# Patient Record
Sex: Male | Born: 1935 | Race: Black or African American | Hispanic: No | Smoking: Current every day smoker
Health system: Southern US, Community
[De-identification: ages and names within clinical notes are randomized; demographics above are authoritative.]

## PROBLEM LIST (undated history)

## (undated) ENCOUNTER — Emergency Department (HOSPITAL_COMMUNITY): Discharge status: Absent without leave | Payer: Medicare HMO

## (undated) DIAGNOSIS — C189 Malignant neoplasm of colon, unspecified: Secondary | ICD-10-CM

## (undated) DIAGNOSIS — M1990 Unspecified osteoarthritis, unspecified site: Secondary | ICD-10-CM

## (undated) DIAGNOSIS — C787 Secondary malignant neoplasm of liver and intrahepatic bile duct: Secondary | ICD-10-CM

## (undated) HISTORY — PX: COLON SURGERY: SHX602

---

## 2012-01-01 HISTORY — PX: OTHER SURGICAL HISTORY: SHX169

## 2012-02-04 ENCOUNTER — Other Ambulatory Visit (HOSPITAL_COMMUNITY): Payer: Self-pay | Admitting: Internal Medicine

## 2012-02-04 DIAGNOSIS — R160 Hepatomegaly, not elsewhere classified: Secondary | ICD-10-CM

## 2012-02-13 ENCOUNTER — Encounter (HOSPITAL_COMMUNITY): Payer: Self-pay | Admitting: Pharmacy Technician

## 2012-02-14 ENCOUNTER — Other Ambulatory Visit: Payer: Self-pay | Admitting: Radiology

## 2012-02-18 ENCOUNTER — Encounter (HOSPITAL_COMMUNITY): Payer: Self-pay

## 2012-02-18 ENCOUNTER — Ambulatory Visit (HOSPITAL_COMMUNITY)
Admission: RE | Admit: 2012-02-18 | Discharge: 2012-02-18 | Disposition: A | Discharge status: Home / Self Care | Payer: Medicare (Managed Care) | Source: Ambulatory Visit | Attending: Internal Medicine | Admitting: Internal Medicine

## 2012-02-18 DIAGNOSIS — C189 Malignant neoplasm of colon, unspecified: Secondary | ICD-10-CM | POA: Insufficient documentation

## 2012-02-18 DIAGNOSIS — R160 Hepatomegaly, not elsewhere classified: Secondary | ICD-10-CM

## 2012-02-18 DIAGNOSIS — K769 Liver disease, unspecified: Secondary | ICD-10-CM | POA: Insufficient documentation

## 2012-02-18 DIAGNOSIS — K639 Disease of intestine, unspecified: Secondary | ICD-10-CM | POA: Insufficient documentation

## 2012-02-18 DIAGNOSIS — C229 Malignant neoplasm of liver, not specified as primary or secondary: Secondary | ICD-10-CM | POA: Insufficient documentation

## 2012-02-18 LAB — CBC
MCHC: 33.1 g/dL (ref 30.0–36.0)
MCV: 93.4 fL (ref 78.0–100.0)
Platelets: 270 10*3/uL (ref 150–400)
RDW: 14.1 % (ref 11.5–15.5)
WBC: 8.7 10*3/uL (ref 4.0–10.5)

## 2012-02-18 MED ORDER — MIDAZOLAM HCL 2 MG/2ML IJ SOLN
INTRAMUSCULAR | Status: AC
  Filled 2012-02-18: qty 4

## 2012-02-18 MED ORDER — FENTANYL CITRATE 0.05 MG/ML IJ SOLN
INTRAMUSCULAR | Status: AC
  Filled 2012-02-18: qty 2

## 2012-02-18 MED ORDER — MIDAZOLAM HCL 2 MG/2ML IJ SOLN
INTRAMUSCULAR | Status: AC
  Filled 2012-02-18: qty 2

## 2012-02-18 MED ORDER — FENTANYL CITRATE 0.05 MG/ML IJ SOLN
INTRAMUSCULAR | Status: AC | PRN
  Administered 2012-02-18 (×2): 50 ug via INTRAVENOUS

## 2012-02-18 MED ORDER — FENTANYL CITRATE 0.05 MG/ML IJ SOLN
INTRAMUSCULAR | Status: AC
  Filled 2012-02-18: qty 4

## 2012-02-18 MED ORDER — MIDAZOLAM HCL 5 MG/5ML IJ SOLN
INTRAMUSCULAR | Status: AC | PRN
  Administered 2012-02-18 (×2): 1 mg via INTRAVENOUS

## 2012-02-18 MED ORDER — SODIUM CHLORIDE 0.9 % IV SOLN
INTRAVENOUS | Status: DC
  Administered 2012-02-18: 13:00:00 via INTRAVENOUS

## 2012-02-18 NOTE — Procedures (Signed)
R lobe liver lesion Bx FNA/Core No comp

## 2012-02-18 NOTE — H&P (Signed)
[name] Fields is an 76 y.o. male.   Chief Complaint: patient with recent diagnosis of colon cancer s/p surgery.  Hepatic lesion noted and imaged. HPI: Hepatic lesion with new diagnosis of malignancy.  Patient presents for biopsy to establish diagnosis.   No past medical history on file.  Past Surgical History  Procedure Date  . Colon surgery     jan 2013    Social History:  reports that he has been smoking Cigarettes.  He has a 50 pack-year smoking history. He does not have any smokeless tobacco history on file. He reports that he drinks about 3.5 ounces of alcohol per week. He reports that he does not use illicit drugs.  Allergies: No Known Allergies  Medications Prior to Admission  Medication Sig Dispense Refill  . ibuprofen (ADVIL,MOTRIN) 200 MG tablet Take 400 mg by mouth every 6 (six) hours as needed. Pain       Medications Prior to Admission  Medication Dose Route Frequency Provider Last Rate Last Dose  . 0.9 %  sodium chloride infusion   Intravenous Continuous D Jeananne Rama, PA 20 mL/hr at 02/18/12 1246    . fentaNYL (SUBLIMAZE) 0.05 MG/ML injection           . fentaNYL (SUBLIMAZE) 0.05 MG/ML injection           . midazolam (VERSED) 2 MG/2ML injection           . midazolam (VERSED) 2 MG/2ML injection            Review of Systems  Constitutional: Negative for fever, chills and weight loss.  Respiratory: Negative for cough and hemoptysis.   Cardiovascular: Negative for chest pain and palpitations.  Gastrointestinal: Positive for abdominal pain. Negative for nausea, vomiting, constipation and blood in stool.  Skin: Negative.   Neurological: Negative for dizziness, sensory change and seizures.  Psychiatric/Behavioral: Negative for depression, suicidal ideas and memory loss. The patient is not nervous/anxious.     Physical Exam  Constitutional: He is oriented to person, place, and time. He appears well-developed.       Thin appearing   HENT:       Poor dentition     Cardiovascular: Normal rate and regular rhythm.  Exam reveals no gallop and no friction rub.   No murmur heard. Respiratory: Effort normal and breath sounds normal. No respiratory distress. He has no wheezes.  GI: Soft. Bowel sounds are normal. He exhibits no distension.  Musculoskeletal: Normal range of motion.  Neurological: He is alert and oriented to person, place, and time.  Skin: Skin is warm and dry.  Psychiatric: He has a normal mood and affect. His behavior is normal. Judgment and thought content normal.     Assessment/Plan Patient for needle core biopsy of hepatic lesion.  Procedure details discussed with patient.  Potential complications including but not limited to infection, bleeding, organ damage, moderate sedation complications and inadequate sampling discussed with his apparent understanding.  Written consent obtained.  Proceed with biopsy of lab results WNL to proceed - currently pending at time of interview with patient.   Kenley Troop D 02/18/2012, 12:54 PM

## 2012-02-22 ENCOUNTER — Encounter (HOSPITAL_COMMUNITY)
Admission: RE | Admit: 2012-02-22 | Discharge: 2012-02-22 | Disposition: A | Discharge status: Home / Self Care | Payer: Medicare (Managed Care) | Source: Ambulatory Visit | Attending: Internal Medicine | Admitting: Internal Medicine

## 2012-02-22 ENCOUNTER — Encounter (HOSPITAL_COMMUNITY): Payer: Self-pay

## 2012-02-22 DIAGNOSIS — R160 Hepatomegaly, not elsewhere classified: Secondary | ICD-10-CM

## 2012-02-22 DIAGNOSIS — K769 Liver disease, unspecified: Secondary | ICD-10-CM | POA: Insufficient documentation

## 2012-02-22 HISTORY — DX: Malignant neoplasm of colon, unspecified: C18.9

## 2012-02-22 LAB — GLUCOSE, CAPILLARY: Glucose-Capillary: 95 mg/dL (ref 70–99)

## 2012-02-22 MED ORDER — FLUDEOXYGLUCOSE F - 18 (FDG) INJECTION
15.6000 | Freq: Once | INTRAVENOUS | Status: AC | PRN
  Administered 2012-02-22: 15.6 via INTRAVENOUS

## 2012-05-20 ENCOUNTER — Encounter: Payer: Medicare Other | Admitting: Internal Medicine

## 2012-05-20 DIAGNOSIS — Z5111 Encounter for antineoplastic chemotherapy: Secondary | ICD-10-CM

## 2012-05-20 DIAGNOSIS — C20 Malignant neoplasm of rectum: Secondary | ICD-10-CM

## 2012-05-20 DIAGNOSIS — Z5112 Encounter for antineoplastic immunotherapy: Secondary | ICD-10-CM

## 2012-05-27 ENCOUNTER — Encounter (INDEPENDENT_AMBULATORY_CARE_PROVIDER_SITE_OTHER): Payer: Medicare (Managed Care) | Admitting: Internal Medicine

## 2012-05-27 DIAGNOSIS — G4700 Insomnia, unspecified: Secondary | ICD-10-CM

## 2012-05-27 DIAGNOSIS — C189 Malignant neoplasm of colon, unspecified: Secondary | ICD-10-CM

## 2012-05-27 DIAGNOSIS — R109 Unspecified abdominal pain: Secondary | ICD-10-CM

## 2012-06-10 ENCOUNTER — Encounter: Payer: Medicare Other | Admitting: Internal Medicine

## 2012-06-10 DIAGNOSIS — Z5111 Encounter for antineoplastic chemotherapy: Secondary | ICD-10-CM

## 2012-06-10 DIAGNOSIS — C20 Malignant neoplasm of rectum: Secondary | ICD-10-CM

## 2012-06-10 DIAGNOSIS — Z5112 Encounter for antineoplastic immunotherapy: Secondary | ICD-10-CM

## 2012-07-01 DIAGNOSIS — C20 Malignant neoplasm of rectum: Secondary | ICD-10-CM

## 2012-07-01 DIAGNOSIS — Z5112 Encounter for antineoplastic immunotherapy: Secondary | ICD-10-CM

## 2012-07-01 DIAGNOSIS — Z5111 Encounter for antineoplastic chemotherapy: Secondary | ICD-10-CM

## 2012-07-10 ENCOUNTER — Encounter: Payer: Medicare Other | Admitting: Internal Medicine

## 2012-07-10 DIAGNOSIS — C189 Malignant neoplasm of colon, unspecified: Secondary | ICD-10-CM

## 2012-07-22 ENCOUNTER — Encounter: Payer: Medicare Other | Admitting: Internal Medicine

## 2012-07-22 DIAGNOSIS — C20 Malignant neoplasm of rectum: Secondary | ICD-10-CM

## 2012-07-22 DIAGNOSIS — D709 Neutropenia, unspecified: Secondary | ICD-10-CM

## 2012-07-28 ENCOUNTER — Encounter: Payer: Medicare Other | Admitting: Internal Medicine

## 2012-07-28 DIAGNOSIS — Z5112 Encounter for antineoplastic immunotherapy: Secondary | ICD-10-CM

## 2012-07-28 DIAGNOSIS — C20 Malignant neoplasm of rectum: Secondary | ICD-10-CM

## 2012-07-28 DIAGNOSIS — Z5111 Encounter for antineoplastic chemotherapy: Secondary | ICD-10-CM

## 2012-08-07 DIAGNOSIS — C20 Malignant neoplasm of rectum: Secondary | ICD-10-CM

## 2012-08-07 DIAGNOSIS — D702 Other drug-induced agranulocytosis: Secondary | ICD-10-CM

## 2012-08-07 DIAGNOSIS — T451X5A Adverse effect of antineoplastic and immunosuppressive drugs, initial encounter: Secondary | ICD-10-CM

## 2012-08-12 ENCOUNTER — Encounter: Payer: Medicare Other | Admitting: Internal Medicine

## 2012-08-12 DIAGNOSIS — D709 Neutropenia, unspecified: Secondary | ICD-10-CM

## 2012-08-12 DIAGNOSIS — C20 Malignant neoplasm of rectum: Secondary | ICD-10-CM

## 2012-08-19 DIAGNOSIS — Z5111 Encounter for antineoplastic chemotherapy: Secondary | ICD-10-CM

## 2012-08-19 DIAGNOSIS — Z5112 Encounter for antineoplastic immunotherapy: Secondary | ICD-10-CM

## 2012-08-19 DIAGNOSIS — C189 Malignant neoplasm of colon, unspecified: Secondary | ICD-10-CM

## 2012-09-09 ENCOUNTER — Encounter: Payer: Medicare Other | Admitting: Internal Medicine

## 2012-09-10 ENCOUNTER — Other Ambulatory Visit (HOSPITAL_COMMUNITY): Payer: Self-pay | Admitting: Internal Medicine

## 2012-09-10 DIAGNOSIS — Z5111 Encounter for antineoplastic chemotherapy: Secondary | ICD-10-CM

## 2012-09-10 DIAGNOSIS — C189 Malignant neoplasm of colon, unspecified: Secondary | ICD-10-CM

## 2012-09-10 DIAGNOSIS — C787 Secondary malignant neoplasm of liver and intrahepatic bile duct: Secondary | ICD-10-CM

## 2012-09-17 ENCOUNTER — Ambulatory Visit
Admission: RE | Admit: 2012-09-17 | Discharge: 2012-09-17 | Disposition: A | Discharge status: Home / Self Care | Payer: Medicare Other | Source: Ambulatory Visit | Attending: Internal Medicine | Admitting: Internal Medicine

## 2012-09-17 VITALS — BP 106/68 | HR 67 | Temp 98.3°F | Resp 16 | Ht 71.0 in | Wt 140.0 lb

## 2012-09-17 DIAGNOSIS — C787 Secondary malignant neoplasm of liver and intrahepatic bile duct: Secondary | ICD-10-CM

## 2012-09-17 HISTORY — DX: Unspecified osteoarthritis, unspecified site: M19.90

## 2012-09-18 ENCOUNTER — Telehealth: Payer: Self-pay | Admitting: Emergency Medicine

## 2012-09-18 NOTE — Telephone Encounter (Signed)
CALLED PT TO confirm CT ANGIO APPT AT [facility]OS[facility] FOR 09-23-12 AT 210PM REGISTER AT 145PM  ALSO CONFIRMED THIS APPT W/ PT. DAUGHTER( CHRISTINA)

## 2012-09-22 DIAGNOSIS — C189 Malignant neoplasm of colon, unspecified: Secondary | ICD-10-CM

## 2012-09-24 ENCOUNTER — Telehealth: Payer: Self-pay | Admitting: Emergency Medicine

## 2012-09-24 NOTE — Telephone Encounter (Signed)
LM FOR DAUGHTER STATING THAT CT ANGIO WAS GOOD FOR Y-90 PROCEDURE, FAXING INFO TO [facility] FOR [employer]. [facility] AND [facility] WILL CONTACT THEM TO SET UP APPT.  ALSO MENTIONED TRIED CALLED PT. AT HOME # AND WAS NOTHING BUT STATIC WHEN PHONE PICKED UP.

## 2012-09-29 ENCOUNTER — Other Ambulatory Visit (HOSPITAL_COMMUNITY): Payer: Self-pay | Admitting: Interventional Radiology

## 2012-09-29 DIAGNOSIS — C787 Secondary malignant neoplasm of liver and intrahepatic bile duct: Secondary | ICD-10-CM

## 2012-09-29 DIAGNOSIS — C189 Malignant neoplasm of colon, unspecified: Secondary | ICD-10-CM

## 2012-10-01 ENCOUNTER — Other Ambulatory Visit: Payer: Self-pay | Admitting: Radiology

## 2012-10-07 ENCOUNTER — Ambulatory Visit (HOSPITAL_COMMUNITY)
Admission: RE | Admit: 2012-10-07 | Discharge: 2012-10-07 | Disposition: A | Discharge status: Home / Self Care | Payer: Medicare Other | Source: Ambulatory Visit | Attending: Interventional Radiology | Admitting: Interventional Radiology

## 2012-10-07 ENCOUNTER — Encounter (HOSPITAL_COMMUNITY): Payer: Self-pay

## 2012-10-07 ENCOUNTER — Other Ambulatory Visit (HOSPITAL_COMMUNITY): Payer: Self-pay | Admitting: Interventional Radiology

## 2012-10-07 ENCOUNTER — Encounter (HOSPITAL_COMMUNITY)
Admission: RE | Admit: 2012-10-07 | Discharge: 2012-10-07 | Disposition: A | Discharge status: Home / Self Care | Payer: Medicare Other | Source: Ambulatory Visit | Attending: Interventional Radiology | Admitting: Interventional Radiology

## 2012-10-07 DIAGNOSIS — C19 Malignant neoplasm of rectosigmoid junction: Secondary | ICD-10-CM | POA: Insufficient documentation

## 2012-10-07 DIAGNOSIS — C787 Secondary malignant neoplasm of liver and intrahepatic bile duct: Secondary | ICD-10-CM

## 2012-10-07 DIAGNOSIS — R109 Unspecified abdominal pain: Secondary | ICD-10-CM | POA: Insufficient documentation

## 2012-10-07 DIAGNOSIS — C189 Malignant neoplasm of colon, unspecified: Secondary | ICD-10-CM

## 2012-10-07 DIAGNOSIS — R062 Wheezing: Secondary | ICD-10-CM | POA: Insufficient documentation

## 2012-10-07 LAB — COMPREHENSIVE METABOLIC PANEL
ALT: 14 U/L (ref 0–53)
Alkaline Phosphatase: 195 U/L — ABNORMAL HIGH (ref 39–117)
CO2: 27 mEq/L (ref 19–32)
Chloride: 101 mEq/L (ref 96–112)
GFR calc Af Amer: >90 mL/min (ref >90)
GFR calc non Af Amer: >90 mL/min (ref >90)
Glucose, Bld: 107 mg/dL — ABNORMAL HIGH (ref 70–99)
Potassium: 4.1 mEq/L (ref 3.5–5.1)
Sodium: 135 mEq/L (ref 135–145)

## 2012-10-07 LAB — CBC
Hemoglobin: 11.2 g/dL — ABNORMAL LOW (ref 13.0–17.0)
MCH: 32.7 pg (ref 26.0–34.0)
RBC: 3.42 MIL/uL — ABNORMAL LOW (ref 4.22–5.81)

## 2012-10-07 LAB — APTT: aPTT: 32 seconds (ref 24–37)

## 2012-10-07 MED ORDER — IOHEXOL 300 MG/ML  SOLN: 95.0000 mL | Freq: Once | INTRAMUSCULAR | Status: DC | PRN

## 2012-10-07 MED ORDER — HEPARIN SOD (PORK) LOCK FLUSH 100 UNIT/ML IV SOLN
INTRAVENOUS | Status: AC
  Filled 2012-10-07: qty 5

## 2012-10-07 MED ORDER — MIDAZOLAM HCL 5 MG/5ML IJ SOLN
INTRAMUSCULAR | Status: AC | PRN
  Administered 2012-10-07: 0.5 mg via INTRAVENOUS
  Administered 2012-10-07: 1 mg via INTRAVENOUS
  Administered 2012-10-07 (×3): 0.5 mg via INTRAVENOUS

## 2012-10-07 MED ORDER — MIDAZOLAM HCL 2 MG/2ML IJ SOLN
INTRAMUSCULAR | Status: AC
  Filled 2012-10-07: qty 6

## 2012-10-07 MED ORDER — SODIUM CHLORIDE 0.9 % IV SOLN
INTRAVENOUS | Status: DC
  Administered 2012-10-07: 10:00:00 via INTRAVENOUS

## 2012-10-07 MED ORDER — CEFAZOLIN SODIUM 1-5 GM-% IV SOLN
1.0000 g | Freq: Once | INTRAVENOUS | Status: AC
  Administered 2012-10-07: 1 g via INTRAVENOUS
  Filled 2012-10-07 (×2): qty 50

## 2012-10-07 MED ORDER — HEPARIN SOD (PORK) LOCK FLUSH 100 UNIT/ML IV SOLN
500.0000 [IU] | INTRAVENOUS | Status: DC
  Administered 2012-10-07: 500 [IU]

## 2012-10-07 MED ORDER — HEPARIN SOD (PORK) LOCK FLUSH 100 UNIT/ML IV SOLN: 500.0000 [IU] | INTRAVENOUS | Status: DC | PRN

## 2012-10-07 MED ORDER — FENTANYL CITRATE 0.05 MG/ML IJ SOLN
INTRAMUSCULAR | Status: AC | PRN
  Administered 2012-10-07 (×4): 50 ug via INTRAVENOUS

## 2012-10-07 MED ORDER — FENTANYL CITRATE 0.05 MG/ML IJ SOLN
INTRAMUSCULAR | Status: AC
  Filled 2012-10-07: qty 6

## 2012-10-07 MED ORDER — HYDROCODONE-ACETAMINOPHEN 5-325 MG PO TABS
ORAL_TABLET | ORAL | Status: AC
  Administered 2012-10-07: 1 via ORAL
  Filled 2012-10-07: qty 1

## 2012-10-07 MED ORDER — LIDOCAINE HCL 1 % IJ SOLN
INTRAMUSCULAR | Status: AC
  Filled 2012-10-07: qty 20

## 2012-10-07 MED ORDER — HYDROCODONE-ACETAMINOPHEN 5-325 MG PO TABS
1.0000 | ORAL_TABLET | ORAL | Status: DC | PRN
  Administered 2012-10-07: 1 via ORAL

## 2012-10-07 MED ORDER — TECHNETIUM TO 99M ALBUMIN AGGREGATED
5.0000 | Freq: Once | INTRAVENOUS | Status: AC | PRN
  Administered 2012-10-07: 5 via INTRAVENOUS

## 2012-10-07 NOTE — Procedures (Signed)
Pre Y-90 study Mesentaric angiogram and embolization GDA embo 5 mCi Tc 36m No comp

## 2012-10-07 NOTE — ED Notes (Signed)
5Fr sheath removed from RFA by Dr. Bonnielee Haff.  Exoseal arterial closure device used.  Manual pressure applied X 5 mins.  R groin level 0.  3+RDP.

## 2012-10-07 NOTE — Progress Notes (Signed)
Patient out of bed and ambulated in hallway and used restroom. No complaints. Denies pain, SOB and dizziness. Dressing stayed CDI.

## 2012-10-07 NOTE — H&P (Signed)
[name] Fields is an 76 y.o. male.   Chief Complaint: colon ca with mets to  Liver (bilateral) Consulted with Dr Bonnielee Haff for Y90 procedure/treatment Scheduled now for pre Y90 arteriogram with possible embolization HPI: Colon ca; liver mets  Past Medical History  Diagnosis Date  . Colon cancer   . Arthritis     Past Surgical History  Procedure Date  . Colon surgery     jan 2013    No family history on file. Social History:  reports that he has been smoking Cigarettes.  He has a 50 pack-year smoking history. He does not have any smokeless tobacco history on file. He reports that he drinks about 3.5 ounces of alcohol per week. He reports that he does not use illicit drugs.  Allergies: No Known Allergies   (Not in a hospital admission)  Results for orders placed during the hospital encounter of 10/07/12 (from the past 48 hour(s))  APTT     Status: Normal   Collection Time   10/07/12  8:50 AM      Component Value Range Comment   aPTT 32  24 - 37 seconds   CBC     Status: Abnormal   Collection Time   10/07/12  8:50 AM      Component Value Range Comment   WBC 10.5  4.0 - 10.5 K/uL    RBC 3.42 (*) 4.22 - 5.81 MIL/uL    Hemoglobin 11.2 (*) 13.0 - 17.0 g/dL    HCT 16.1 (*) 09.6 - 52.0 %    MCV 95.3  78.0 - 100.0 fL    MCH 32.7  26.0 - 34.0 pg    MCHC 34.4  30.0 - 36.0 g/dL    RDW 04.5 (*) 40.9 - 15.5 %    Platelets 139 (*) 150 - 400 K/uL   PROTIME-INR     Status: Normal   Collection Time   10/07/12  8:50 AM      Component Value Range Comment   Prothrombin Time 12.6  11.6 - 15.2 seconds    INR 0.95  0.00 - 1.49    No results found.  Review of Systems  Constitutional: Negative for fever.  Respiratory: Negative for shortness of breath.   Cardiovascular: Negative for chest pain.  Gastrointestinal: Positive for abdominal pain.  Musculoskeletal: Negative for back pain.  Neurological: Negative for headaches.    Blood pressure 105/67, pulse 66, temperature 97.9 F (36.6 C),  temperature source Oral, resp. rate 18, SpO2 98.00%. Physical Exam  Constitutional: He is oriented to person, place, and time. He appears well-developed.  Cardiovascular: Normal rate, regular rhythm and normal heart sounds.   No murmur heard. Respiratory: Effort normal. He has wheezes.  GI: Soft. Bowel sounds are normal. There is no tenderness.  Musculoskeletal: Normal range of motion.  Neurological: He is alert and oriented to person, place, and time.  Skin: Skin is warm and dry.  Psychiatric: He has a normal mood and affect. His behavior is normal. Judgment and thought content normal.     Assessment/Plan Colon ca; liver mets - bilateral Scheduled for pre Y90 mesenteric arteriogram with poss embolization today  and treatment at later date Consulted with Dr Bonnielee Haff 09/17/12 Pt and family aware of procedure benefits and risks and agreeable to proceed Consent signed and in chart.  Tramaine Snell A 10/07/2012, 9:18 AM

## 2012-10-07 NOTE — Progress Notes (Signed)
Pt arrives via bed post  Pre Y-90 . Pt states he has no pain from procedure on right leg but the left leg chronic pain is a 6/10

## 2012-10-15 ENCOUNTER — Encounter (HOSPITAL_COMMUNITY): Payer: Self-pay | Admitting: Pharmacy Technician

## 2012-10-17 ENCOUNTER — Other Ambulatory Visit: Payer: Self-pay | Admitting: Radiology

## 2012-10-22 ENCOUNTER — Ambulatory Visit (HOSPITAL_COMMUNITY)
Admission: RE | Admit: 2012-10-22 | Discharge: 2012-10-22 | Disposition: A | Discharge status: Home / Self Care | Payer: Medicare Other | Source: Ambulatory Visit | Attending: Interventional Radiology | Admitting: Interventional Radiology

## 2012-10-22 ENCOUNTER — Other Ambulatory Visit (HOSPITAL_COMMUNITY): Payer: Self-pay | Admitting: Interventional Radiology

## 2012-10-22 ENCOUNTER — Encounter (HOSPITAL_COMMUNITY): Payer: Self-pay

## 2012-10-22 ENCOUNTER — Inpatient Hospital Stay (HOSPITAL_COMMUNITY): Admission: RE | Admit: 2012-10-22 | Payer: Medicare Other | Source: Ambulatory Visit

## 2012-10-22 ENCOUNTER — Other Ambulatory Visit: Payer: Self-pay | Admitting: Radiology

## 2012-10-22 ENCOUNTER — Encounter (HOSPITAL_COMMUNITY)
Admission: RE | Admit: 2012-10-22 | Discharge: 2012-10-22 | Disposition: A | Discharge status: Home / Self Care | Payer: Medicare Other | Source: Ambulatory Visit | Attending: Interventional Radiology | Admitting: Interventional Radiology

## 2012-10-22 DIAGNOSIS — C189 Malignant neoplasm of colon, unspecified: Secondary | ICD-10-CM

## 2012-10-22 DIAGNOSIS — C787 Secondary malignant neoplasm of liver and intrahepatic bile duct: Secondary | ICD-10-CM

## 2012-10-22 DIAGNOSIS — C19 Malignant neoplasm of rectosigmoid junction: Secondary | ICD-10-CM | POA: Insufficient documentation

## 2012-10-22 LAB — COMPREHENSIVE METABOLIC PANEL
ALT: 13 U/L (ref 0–53)
AST: 20 U/L (ref 0–37)
Alkaline Phosphatase: 147 U/L — ABNORMAL HIGH (ref 39–117)
CO2: 22 mEq/L (ref 19–32)
GFR calc Af Amer: >90 mL/min (ref >90)
GFR calc non Af Amer: >90 mL/min (ref >90)
Glucose, Bld: 108 mg/dL — ABNORMAL HIGH (ref 70–99)
Potassium: 4.4 mEq/L (ref 3.5–5.1)
Sodium: 133 mEq/L — ABNORMAL LOW (ref 135–145)

## 2012-10-22 LAB — CBC
Hemoglobin: 10.6 g/dL — ABNORMAL LOW (ref 13.0–17.0)
Platelets: 257 10*3/uL (ref 150–400)
RBC: 3.39 MIL/uL — ABNORMAL LOW (ref 4.22–5.81)
WBC: 6.8 10*3/uL (ref 4.0–10.5)

## 2012-10-22 LAB — APTT: aPTT: 30 seconds (ref 24–37)

## 2012-10-22 MED ORDER — NITROGLYCERIN 1 MG/10 ML FOR IR/CATH LAB
100.0000 ug | INTRA_ARTERIAL | Status: AC
  Administered 2012-10-22: 100 ug via INTRA_ARTERIAL

## 2012-10-22 MED ORDER — MIDAZOLAM HCL 2 MG/2ML IJ SOLN
INTRAMUSCULAR | Status: AC | PRN
  Administered 2012-10-22 (×4): 1 mg via INTRAVENOUS

## 2012-10-22 MED ORDER — PIPERACILLIN SOD-TAZOBACTAM SO 2.25 (2-0.25) G IV SOLR
3.3750 g | Freq: Once | INTRAVENOUS | Status: DC
  Filled 2012-10-22: qty 3.38

## 2012-10-22 MED ORDER — PANTOPRAZOLE SODIUM 40 MG IV SOLR
40.0000 mg | Freq: Once | INTRAVENOUS | Status: AC
  Administered 2012-10-22: 40 mg via INTRAVENOUS

## 2012-10-22 MED ORDER — PANTOPRAZOLE SODIUM 40 MG IV SOLR
INTRAVENOUS | Status: AC
  Filled 2012-10-22: qty 40

## 2012-10-22 MED ORDER — DOCUSATE SODIUM 100 MG PO CAPS
100.0000 mg | ORAL_CAPSULE | Freq: Two times a day (BID) | ORAL | Status: DC
  Filled 2012-10-22: qty 1

## 2012-10-22 MED ORDER — LIDOCAINE HCL 1 % IJ SOLN
INTRAMUSCULAR | Status: AC
  Filled 2012-10-22: qty 20

## 2012-10-22 MED ORDER — SODIUM CHLORIDE 0.9 % IJ SOLN: 3.0000 mL | INTRAMUSCULAR | Status: DC | PRN

## 2012-10-22 MED ORDER — IOHEXOL 300 MG/ML  SOLN
60.0000 mL | Freq: Once | INTRAMUSCULAR | Status: AC | PRN
  Administered 2012-10-22: 60 mL via INTRA_ARTERIAL

## 2012-10-22 MED ORDER — MIDAZOLAM HCL 2 MG/2ML IJ SOLN
INTRAMUSCULAR | Status: AC
  Filled 2012-10-22: qty 6

## 2012-10-22 MED ORDER — ONDANSETRON HCL 4 MG/2ML IJ SOLN: 4.0000 mg | Freq: Four times a day (QID) | INTRAMUSCULAR | Status: DC | PRN

## 2012-10-22 MED ORDER — ONDANSETRON HCL 4 MG/2ML IJ SOLN
INTRAMUSCULAR | Status: AC
  Filled 2012-10-22: qty 2

## 2012-10-22 MED ORDER — KETOROLAC TROMETHAMINE 30 MG/ML IJ SOLN
INTRAMUSCULAR | Status: AC
  Filled 2012-10-22: qty 1

## 2012-10-22 MED ORDER — SODIUM CHLORIDE 0.9 % IJ SOLN: 3.0000 mL | Freq: Two times a day (BID) | INTRAMUSCULAR | Status: DC

## 2012-10-22 MED ORDER — DEXAMETHASONE SODIUM PHOSPHATE 10 MG/ML IJ SOLN
20.0000 mg | Freq: Once | INTRAMUSCULAR | Status: AC
  Administered 2012-10-22: 20 mg via INTRAVENOUS

## 2012-10-22 MED ORDER — ONDANSETRON HCL 4 MG/2ML IJ SOLN
4.0000 mg | Freq: Once | INTRAMUSCULAR | Status: AC
  Administered 2012-10-22: 4 mg via INTRAVENOUS
  Filled 2012-10-22: qty 2

## 2012-10-22 MED ORDER — FENTANYL CITRATE 0.05 MG/ML IJ SOLN
INTRAMUSCULAR | Status: AC
  Filled 2012-10-22: qty 6

## 2012-10-22 MED ORDER — OMEPRAZOLE 20 MG PO CPDR: 20.0000 mg | DELAYED_RELEASE_CAPSULE | Freq: Every day | ORAL | Status: DC

## 2012-10-22 MED ORDER — FENTANYL CITRATE 0.05 MG/ML IJ SOLN
INTRAMUSCULAR | Status: AC | PRN
  Administered 2012-10-22 (×4): 50 ug via INTRAVENOUS

## 2012-10-22 MED ORDER — YTTRIUM 90 INJECTION: 27.7000 | INJECTION | Freq: Once | INTRAVENOUS | Status: DC

## 2012-10-22 MED ORDER — PROMETHAZINE HCL 25 MG PO TABS: 25.0000 mg | ORAL_TABLET | Freq: Three times a day (TID) | ORAL | Status: DC | PRN

## 2012-10-22 MED ORDER — SODIUM CHLORIDE 0.9 % IV SOLN
INTRAVENOUS | Status: DC
  Administered 2012-10-22: 09:00:00 via INTRAVENOUS

## 2012-10-22 MED ORDER — KETOROLAC TROMETHAMINE 30 MG/ML IJ SOLN
30.0000 mg | Freq: Once | INTRAMUSCULAR | Status: AC
  Administered 2012-10-22: 30 mg via INTRAVENOUS
  Filled 2012-10-22: qty 1

## 2012-10-22 MED ORDER — PIPERACILLIN-TAZOBACTAM 3.375 G IVPB 30 MIN
3.3750 g | Freq: Once | INTRAVENOUS | Status: AC
  Administered 2012-10-22: 3.375 g via INTRAVENOUS
  Filled 2012-10-22: qty 50

## 2012-10-22 MED ORDER — PROMETHAZINE HCL 25 MG RE SUPP
25.0000 mg | Freq: Three times a day (TID) | RECTAL | Status: DC | PRN
  Filled 2012-10-22: qty 1

## 2012-10-22 MED ORDER — SODIUM CHLORIDE 0.9 % IV SOLN: 250.0000 mL | INTRAVENOUS | Status: DC | PRN

## 2012-10-22 MED ORDER — DEXAMETHASONE SODIUM PHOSPHATE 10 MG/ML IJ SOLN
INTRAMUSCULAR | Status: AC
  Filled 2012-10-22: qty 2

## 2012-10-22 NOTE — ED Notes (Signed)
Occlusive gauze tegaderm drsg applied to RFA puncture site.  Clean, dry, intact.

## 2012-10-22 NOTE — Progress Notes (Signed)
Patient ambulated in hallway and then to restroom. Patient denies SOB, pain and lightheadedness.

## 2012-10-22 NOTE — Procedures (Signed)
Y-90 hepatic embolization R lobe 28.3 mCi No comp

## 2012-10-22 NOTE — ED Notes (Signed)
Pt transported to [facility] via bed with portable monitor and RN.  Lamont Snowball, RN assumed care.

## 2012-10-22 NOTE — Discharge Instructions (Signed)
° °  Post Y-90 Radioembolization Discharge Instructions ° °You have been given a radioactive material during your procedure.  While it is safe for you to be discharged home from the hospital, you need to proceed directly home.   ° °Do not use public transportation, including air travel, lasting more than 2 hours for 1 week. ° °Avoid crowded public places for 1 week. ° °Adult visitors should try to avoid close contact with you for 1 week.   ° °Children and pregnant females should not visit or have close contact with you for 1 week. ° °Items that you touch are not radioactive. ° °Do not sleep in the same bed as your partner for 1 week, and a condom should be used for sexual activity during the first 24 hours. ° °Your blood may be radioactive and caution should be used if any bleeding occurs during the recovery period. ° °Body fluids may be radioactive for 24 hours.  Wash your hands after voiding.  Men should sit to urinate.  Dispose of any soiled materials (flush down toilet or place in trash at home) during the first day. ° °Drink 6 to 8 glasses of fluids per day for 5 days to hydrate yourself. ° °If you need to see a doctor during the first week, you must let them know that you were treated with yttrium-90 microspheres, and will be slightly radioactive.  They can call Interventional Radiology [phone] with any questions. °

## 2012-10-22 NOTE — H&P (Signed)
Chief Complaint: Colon cancer with mets to liver HPI: [name] Fields is an 76 y.o. male with hx of colon cancer and liver mets to each lobe. He has seen Dr. Bonnielee Haff in consult and is in the midstm of Y-90 series. He underwent initial angiogram with coil embo of GDA a few weeks ago. He is now scheduled for first Y-90 treatment. He recalls the procedure and is familiar with angiogram. He has been well since his last intervention. Denies abd pain, fever, N/V No new illnesses or c/o otherwise. He did have some black coffee at 0600 today, otherwise NPO  Past Medical History:  Past Medical History  Diagnosis Date  . Colon cancer   . Arthritis     Past Surgical History:  Past Surgical History  Procedure Date  . Colon surgery     jan 2013    Family History: No family history on file.  Social History:  reports that he has been smoking Cigarettes.  He has a 50 pack-year smoking history. He does not have any smokeless tobacco history on file. He reports that he drinks about 3.5 ounces of alcohol per week. He reports that he does not use illicit drugs.  Allergies: No Known Allergies  Medications: HYDROcodone-acetaminophen (NORCO/VICODIN) 5-325 MG per tablet (Taking) 09/25/2012 Sig - Route: Take 1 tablet by mouth Every 6 hours as needed. For pain - Oral Class: Historical Med Number of times this order has been changed since signing: 1 Order [address] ibuprofen (ADVIL,MOTRIN) 200 MG tablet (Taking) Sig - Route: Take 400 mg by mouth every 6 (six) hours as needed. Pain -   Please HPI for pertinent positives, otherwise complete 10 system ROS negative.  Physical Exam: Blood pressure 106/74, pulse 70, temperature 97.6 F (36.4 C), temperature source Oral, resp. rate 18, height 5\' 11"  (1.803 m), weight 140 lb (63.504 kg), SpO2 98.00%. Body mass index is 19.53 kg/(m^2).   General Appearance:  Alert, cooperative, no distress, appears stated age  Head:  Normocephalic, without obvious abnormality,  atraumatic  ENT: Unremarkable  Neck: Supple, symmetrical, trachea midline, no adenopathy, thyroid: not enlarged, symmetric, no tenderness/mass/nodules  Lungs:   Clear to auscultation bilaterally, no w/r/r, respirations unlabored without use of accessory muscles.  Chest Wall:  No tenderness or deformity. Small brown know with scab at upper outer corner over post hub, NT, no erythema  Heart:  Regular rate and rhythm, S1, S2 normal, no murmur, rub or gallop. Carotids 2+ without bruit.  Abdomen:   Soft, non-tender, non distended. Bowel sounds active all four quadrants,  no masses, no organomegaly.  Extremities: Extremities normal, atraumatic, no cyanosis or edema  Pulses: 2+ and symmetric  Neurologic: Normal affect, no gross deficits.   No results found for this or any previous visit (from the past 48 hour(s)). No results found.  Assessment/Plan Colon cancer with bilateral liver mets Proceed with Y-90 treatment today Labs pending As precaution, will start peripheral IV. Discussed procedure, including risks, complications, and recovery with pt and family. Consent signed in chart  Brayton El PA-C 10/22/2012, 8:17 AM

## 2012-10-22 NOTE — Progress Notes (Signed)
Arrived today for prep for Y-90 in Radiology. Pt states he has 6 oz of black coffee at 0600 today. This was reported to Radiology/Nuclear Med. We are to continue with prep for procedure

## 2012-10-22 NOTE — ED Notes (Signed)
5 Fr Sheath removed for RFA by Dr. Bonnielee Haff.  Hemostasis achieved by using Exoseal device.  Manual pressure applied to artery port deployment X 2 mins by Dr. Bonnielee Haff.  R groin level 0, +2 RDP.

## 2012-10-22 NOTE — Progress Notes (Signed)
Pt has PAC right chest ,just lateral to Port is a pensil eraser sized dark raised area, no drainage that pt says "is not a mole and has come up since the port was put in and it is sore" Caryn Bee B here to speak with pt and evaluated this. It was decided to start peripheral IV for Y-90 procedure and have the port evaluated by Dr Bonnielee Haff when he sees pt. Peripheral IV and lab work drawn and tubed to lab without problem

## 2012-10-22 NOTE — ED Notes (Signed)
Pt transported to Nuc Med for post Y-90 treatment imaging via bed with nurse and portable monitor.

## 2012-10-24 DIAGNOSIS — C20 Malignant neoplasm of rectum: Secondary | ICD-10-CM

## 2012-10-24 DIAGNOSIS — Z23 Encounter for immunization: Secondary | ICD-10-CM

## 2012-10-24 DIAGNOSIS — C787 Secondary malignant neoplasm of liver and intrahepatic bile duct: Secondary | ICD-10-CM

## 2012-11-06 ENCOUNTER — Ambulatory Visit
Admission: RE | Admit: 2012-11-06 | Discharge: 2012-11-06 | Disposition: A | Discharge status: Home / Self Care | Payer: Medicare Other | Source: Ambulatory Visit | Attending: Radiology | Admitting: Radiology

## 2012-11-06 VITALS — BP 109/67 | HR 70 | Temp 97.9°F | Resp 16 | Ht 71.0 in | Wt 145.0 lb

## 2012-11-06 DIAGNOSIS — C787 Secondary malignant neoplasm of liver and intrahepatic bile duct: Secondary | ICD-10-CM

## 2012-11-06 DIAGNOSIS — C189 Malignant neoplasm of colon, unspecified: Secondary | ICD-10-CM

## 2012-11-06 DIAGNOSIS — K769 Liver disease, unspecified: Secondary | ICD-10-CM

## 2012-11-06 NOTE — Progress Notes (Signed)
Appetite good.  Has started gaining "a little weight".  Denies nausea, vomiting, or changes in bowel habits.  Denies bloating or Right flank discomfort.  States that he only occasionally required pain meds for arthritis.  Sleeping pattern unchanged.  Able to complete ADL's without assistance.  Endurance good.

## 2012-11-17 ENCOUNTER — Other Ambulatory Visit (HOSPITAL_COMMUNITY): Payer: Self-pay | Admitting: Interventional Radiology

## 2012-11-17 DIAGNOSIS — C19 Malignant neoplasm of rectosigmoid junction: Secondary | ICD-10-CM

## 2012-11-17 DIAGNOSIS — C787 Secondary malignant neoplasm of liver and intrahepatic bile duct: Secondary | ICD-10-CM

## 2012-12-08 ENCOUNTER — Encounter (INDEPENDENT_AMBULATORY_CARE_PROVIDER_SITE_OTHER): Payer: Medicare Other | Admitting: Internal Medicine

## 2012-12-08 DIAGNOSIS — C189 Malignant neoplasm of colon, unspecified: Secondary | ICD-10-CM

## 2012-12-10 ENCOUNTER — Encounter (HOSPITAL_COMMUNITY): Payer: Self-pay | Admitting: Pharmacy Technician

## 2012-12-15 ENCOUNTER — Other Ambulatory Visit: Payer: Self-pay | Admitting: Radiology

## 2012-12-16 ENCOUNTER — Other Ambulatory Visit (HOSPITAL_COMMUNITY): Payer: Self-pay | Admitting: Interventional Radiology

## 2012-12-16 ENCOUNTER — Ambulatory Visit (HOSPITAL_COMMUNITY)
Admission: RE | Admit: 2012-12-16 | Discharge: 2012-12-16 | Disposition: A | Discharge status: Home / Self Care | Payer: Medicare Other | Source: Ambulatory Visit | Attending: Interventional Radiology | Admitting: Interventional Radiology

## 2012-12-16 ENCOUNTER — Encounter (HOSPITAL_COMMUNITY)
Admission: RE | Admit: 2012-12-16 | Discharge: 2012-12-16 | Disposition: A | Discharge status: Home / Self Care | Payer: Medicare Other | Source: Ambulatory Visit | Attending: Interventional Radiology | Admitting: Interventional Radiology

## 2012-12-16 ENCOUNTER — Encounter (HOSPITAL_COMMUNITY): Payer: Self-pay

## 2012-12-16 DIAGNOSIS — C19 Malignant neoplasm of rectosigmoid junction: Secondary | ICD-10-CM

## 2012-12-16 DIAGNOSIS — C787 Secondary malignant neoplasm of liver and intrahepatic bile duct: Secondary | ICD-10-CM

## 2012-12-16 DIAGNOSIS — C801 Malignant (primary) neoplasm, unspecified: Secondary | ICD-10-CM | POA: Insufficient documentation

## 2012-12-16 LAB — COMPREHENSIVE METABOLIC PANEL
AST: 31 U/L (ref 0–37)
Albumin: 3.3 g/dL — ABNORMAL LOW (ref 3.5–5.2)
Chloride: 102 mEq/L (ref 96–112)
Creatinine, Ser: 0.77 mg/dL (ref 0.50–1.35)
Potassium: 4.1 mEq/L (ref 3.5–5.1)
Total Bilirubin: 0.3 mg/dL (ref 0.3–1.2)
Total Protein: 7.1 g/dL (ref 6.0–8.3)

## 2012-12-16 LAB — CBC
MCHC: 32.6 g/dL (ref 30.0–36.0)
Platelets: 159 10*3/uL (ref 150–400)
RDW: 14.4 % (ref 11.5–15.5)
WBC: 5.4 10*3/uL (ref 4.0–10.5)

## 2012-12-16 LAB — APTT: aPTT: 32 seconds (ref 24–37)

## 2012-12-16 LAB — PROTIME-INR
INR: 0.92 (ref 0.00–1.49)
Prothrombin Time: 12.3 seconds (ref 11.6–15.2)

## 2012-12-16 MED ORDER — FENTANYL CITRATE 0.05 MG/ML IJ SOLN
INTRAMUSCULAR | Status: AC | PRN
  Administered 2012-12-16: 25 ug via INTRAVENOUS
  Administered 2012-12-16 (×2): 50 ug via INTRAVENOUS
  Administered 2012-12-16: 25 ug via INTRAVENOUS

## 2012-12-16 MED ORDER — DEXAMETHASONE SODIUM PHOSPHATE 10 MG/ML IJ SOLN
20.0000 mg | Freq: Once | INTRAMUSCULAR | Status: AC
  Administered 2012-12-16: 20 mg via INTRAVENOUS

## 2012-12-16 MED ORDER — PANTOPRAZOLE SODIUM 40 MG IV SOLR
INTRAVENOUS | Status: AC
  Filled 2012-12-16: qty 40

## 2012-12-16 MED ORDER — FENTANYL CITRATE 0.05 MG/ML IJ SOLN
INTRAMUSCULAR | Status: AC
  Filled 2012-12-16: qty 6

## 2012-12-16 MED ORDER — SODIUM CHLORIDE 0.45 % IV SOLN
INTRAVENOUS | Status: DC
  Administered 2012-12-16: 07:00:00 via INTRAVENOUS

## 2012-12-16 MED ORDER — ONDANSETRON HCL 4 MG/2ML IJ SOLN
4.0000 mg | Freq: Once | INTRAMUSCULAR | Status: AC
  Administered 2012-12-16: 4 mg via INTRAVENOUS

## 2012-12-16 MED ORDER — PANTOPRAZOLE SODIUM 40 MG IV SOLR
40.0000 mg | Freq: Once | INTRAVENOUS | Status: AC
  Administered 2012-12-16: 40 mg via INTRAVENOUS

## 2012-12-16 MED ORDER — HEPARIN SOD (PORK) LOCK FLUSH 100 UNIT/ML IV SOLN
500.0000 [IU] | INTRAVENOUS | Status: AC | PRN
  Administered 2012-12-16: 500 [IU]
  Filled 2012-12-16: qty 5

## 2012-12-16 MED ORDER — KETOROLAC TROMETHAMINE 30 MG/ML IJ SOLN
30.0000 mg | Freq: Once | INTRAMUSCULAR | Status: AC
  Administered 2012-12-16: 30 mg via INTRAVENOUS

## 2012-12-16 MED ORDER — LIDOCAINE HCL 1 % IJ SOLN
INTRAMUSCULAR | Status: AC
  Filled 2012-12-16: qty 20

## 2012-12-16 MED ORDER — KETOROLAC TROMETHAMINE 30 MG/ML IJ SOLN
INTRAMUSCULAR | Status: AC
  Filled 2012-12-16: qty 1

## 2012-12-16 MED ORDER — ONDANSETRON HCL 4 MG/2ML IJ SOLN
INTRAMUSCULAR | Status: AC
  Filled 2012-12-16: qty 2

## 2012-12-16 MED ORDER — IOHEXOL 300 MG/ML  SOLN
80.0000 mL | Freq: Once | INTRAMUSCULAR | Status: AC | PRN
  Administered 2012-12-16: 80 mL via INTRA_ARTERIAL

## 2012-12-16 MED ORDER — DEXAMETHASONE SODIUM PHOSPHATE 10 MG/ML IJ SOLN
INTRAMUSCULAR | Status: AC
  Filled 2012-12-16: qty 2

## 2012-12-16 MED ORDER — MIDAZOLAM HCL 2 MG/2ML IJ SOLN
INTRAMUSCULAR | Status: AC | PRN
Start: 2012-12-16 — End: 2012-12-16
  Administered 2012-12-16: 1 mg via INTRAVENOUS
  Administered 2012-12-16 (×2): 0.5 mg via INTRAVENOUS
  Administered 2012-12-16: 1 mg via INTRAVENOUS

## 2012-12-16 MED ORDER — YTTRIUM 90 INJECTION: 15.9000 | INJECTION | Freq: Once | INTRAVENOUS | Status: DC

## 2012-12-16 MED ORDER — PIPERACILLIN-TAZOBACTAM 3.375 G IVPB
3.3750 g | Freq: Once | INTRAVENOUS | Status: AC
  Administered 2012-12-16: 3.375 g via INTRAVENOUS
  Filled 2012-12-16: qty 50

## 2012-12-16 MED ORDER — MIDAZOLAM HCL 2 MG/2ML IJ SOLN
INTRAMUSCULAR | Status: AC
  Filled 2012-12-16: qty 6

## 2012-12-16 NOTE — Procedures (Signed)
Y 90 treatment L lobe 15 mCi No comp

## 2012-12-16 NOTE — ED Notes (Signed)
5 Fr sheath removed form R fem artery by Dr. Bonnielee Haff.  Hemostasis achieved with Exoseal device.  R groin level 0.  Manual pressure applied X 3 mins.

## 2012-12-16 NOTE — ED Notes (Signed)
R groin level 0.  Tegaderm/gauze occlusive dressing CDI. 3+RDP.

## 2012-12-16 NOTE — ED Notes (Signed)
Pt transport to nuc med via bed with RN and monitor.

## 2012-12-16 NOTE — H&P (Signed)
Chief Complaint: Colon cancer with mets to liver HPI: [name] Fields is an 76 y.o. male with hx of colon cancer and liver mets to each lobe. He has seen Dr. Bonnielee Haff in consult and is in the midstm of Y-90 series. He underwent initial angiogram with coil embo of GDA a few weeks ago. He then underwent his Y-90 treatment to his right lobe met and is now scheduled for the left lobe treatment. He has been well since his last intervention. Denies abd pain, fever, N/V No new illnesses or c/o otherwise.   Past Medical History:  Past Medical History  Diagnosis Date  . Colon cancer   . Arthritis     Past Surgical History:  Past Surgical History  Procedure Date  . Colon surgery     jan 2013    Family History: No family history on file.  Social History:  reports that he has been smoking Cigarettes.  He has a 50 pack-year smoking history. He does not have any smokeless tobacco history on file. He reports that he drinks about 3.5 ounces of alcohol per week. He reports that he does not use illicit drugs.  Allergies: No Known Allergies  Medications: HYDROcodone-acetaminophen (NORCO/VICODIN) 5-325 MG per tablet (Taking) 09/25/2012 Sig - Route: Take 1 tablet by mouth Every 6 hours as needed. For pain - Oral Class: Historical Med Number of times this order has been changed since signing: 1 Order [address] Trail ibuprofen (ADVIL,MOTRIN) 200 MG tablet (Taking) Sig - Route: Take 400 mg by mouth every 6 (six) hours as needed. Pain -   Please HPI for pertinent positives, otherwise complete 10 system ROS negative.  Physical Exam: Blood pressure 117/76, pulse 60, temperature 97.7 F (36.5 C), temperature source Oral, resp. rate 18, SpO2 100.00%. There is no height or weight on file to calculate BMI.   General Appearance:  Alert, cooperative, no distress, appears stated age  Head:  Normocephalic, without obvious abnormality, atraumatic  ENT: Unremarkable  Neck: Supple, symmetrical, trachea midline, no  adenopathy, thyroid: not enlarged, symmetric, no tenderness/mass/nodules  Lungs:   Clear to auscultation bilaterally, no w/r/r, respirations unlabored without use of accessory muscles.  Chest Wall:  No tenderness or deformity. Small brown know with scab at upper outer corner over post hub, NT, no erythema  Heart:  Regular rate and rhythm, S1, S2 normal, no murmur, rub or gallop. Carotids 2+ without bruit.  Abdomen:   Soft, non-tender, non distended. Bowel sounds active all four quadrants,  no masses, no organomegaly.  Extremities: Extremities normal, atraumatic, no cyanosis or edema  Pulses: 2+ and symmetric  Neurologic: Normal affect, no gross deficits.   No results found for this or any previous visit (from the past 48 hour(s)). No results found.  Assessment/Plan Colon cancer with bilateral liver mets Proceed with Y-90 treatment today of left lobe mets Labs pending Port has been accessed Discussed procedure, including risks, complications, and recovery with pt and family. Consent signed in chart  Brayton El PA-C 12/16/2012, 7:50 AM

## 2012-12-17 ENCOUNTER — Telehealth: Payer: Self-pay | Admitting: Emergency Medicine

## 2012-12-17 ENCOUNTER — Other Ambulatory Visit: Payer: Self-pay | Admitting: Emergency Medicine

## 2012-12-17 DIAGNOSIS — IMO0001 Reserved for inherently not codable concepts without codable children: Secondary | ICD-10-CM

## 2012-12-17 MED ORDER — OMEPRAZOLE 10 MG PO CPDR: 10.0000 mg | DELAYED_RELEASE_CAPSULE | Freq: Every day | ORAL | Status: DC

## 2012-12-17 NOTE — Telephone Encounter (Signed)
LMOVM W/ ALL RX INFO TO REFILL PTS. PRILOSEC

## 2012-12-17 NOTE — Telephone Encounter (Signed)
CALLED PT TO CONTINUE PRILOSEC FOR THE NEXT 30 DAYS, WE WILL CALL IN RX AND PT WILL PU TODAY

## 2013-01-19 DIAGNOSIS — C787 Secondary malignant neoplasm of liver and intrahepatic bile duct: Secondary | ICD-10-CM

## 2013-01-19 DIAGNOSIS — C20 Malignant neoplasm of rectum: Secondary | ICD-10-CM

## 2013-01-19 DIAGNOSIS — Z452 Encounter for adjustment and management of vascular access device: Secondary | ICD-10-CM

## 2013-02-26 ENCOUNTER — Encounter (INDEPENDENT_AMBULATORY_CARE_PROVIDER_SITE_OTHER): Payer: Medicare Other | Admitting: Internal Medicine

## 2013-02-26 DIAGNOSIS — C189 Malignant neoplasm of colon, unspecified: Secondary | ICD-10-CM

## 2013-02-26 DIAGNOSIS — C787 Secondary malignant neoplasm of liver and intrahepatic bile duct: Secondary | ICD-10-CM

## 2013-02-26 DIAGNOSIS — D649 Anemia, unspecified: Secondary | ICD-10-CM

## 2013-03-11 ENCOUNTER — Encounter: Payer: Medicare Other | Admitting: Internal Medicine

## 2013-03-11 DIAGNOSIS — C787 Secondary malignant neoplasm of liver and intrahepatic bile duct: Secondary | ICD-10-CM

## 2013-03-11 DIAGNOSIS — C189 Malignant neoplasm of colon, unspecified: Secondary | ICD-10-CM

## 2013-03-17 ENCOUNTER — Other Ambulatory Visit (HOSPITAL_COMMUNITY): Payer: Self-pay | Admitting: Interventional Radiology

## 2013-03-17 DIAGNOSIS — C189 Malignant neoplasm of colon, unspecified: Secondary | ICD-10-CM

## 2013-03-31 DIAGNOSIS — Z5112 Encounter for antineoplastic immunotherapy: Secondary | ICD-10-CM

## 2013-03-31 DIAGNOSIS — Z5111 Encounter for antineoplastic chemotherapy: Secondary | ICD-10-CM

## 2013-03-31 DIAGNOSIS — C20 Malignant neoplasm of rectum: Secondary | ICD-10-CM

## 2013-04-02 DIAGNOSIS — C20 Malignant neoplasm of rectum: Secondary | ICD-10-CM

## 2013-04-08 ENCOUNTER — Ambulatory Visit
Admission: RE | Admit: 2013-04-08 | Discharge: 2013-04-08 | Disposition: A | Discharge status: Home / Self Care | Payer: Medicare PPO | Source: Ambulatory Visit | Attending: Interventional Radiology | Admitting: Interventional Radiology

## 2013-04-08 DIAGNOSIS — C189 Malignant neoplasm of colon, unspecified: Secondary | ICD-10-CM

## 2013-04-08 DIAGNOSIS — C787 Secondary malignant neoplasm of liver and intrahepatic bile duct: Secondary | ICD-10-CM

## 2013-04-13 NOTE — Progress Notes (Signed)
PT DOING GREAT! NO COMPLAINTS OF PAIN,BOWEL CHANGES, FEVER, CHILLS.  HE DOES HAVE TINGLING IN HIS TOES/FEET.

## 2013-04-20 ENCOUNTER — Encounter (INDEPENDENT_AMBULATORY_CARE_PROVIDER_SITE_OTHER): Payer: Medicare PPO | Admitting: Internal Medicine

## 2013-04-20 DIAGNOSIS — M549 Dorsalgia, unspecified: Secondary | ICD-10-CM

## 2013-04-20 DIAGNOSIS — C187 Malignant neoplasm of sigmoid colon: Secondary | ICD-10-CM

## 2013-04-20 DIAGNOSIS — J301 Allergic rhinitis due to pollen: Secondary | ICD-10-CM

## 2013-04-20 DIAGNOSIS — C787 Secondary malignant neoplasm of liver and intrahepatic bile duct: Secondary | ICD-10-CM

## 2013-04-21 DIAGNOSIS — C20 Malignant neoplasm of rectum: Secondary | ICD-10-CM

## 2013-04-21 DIAGNOSIS — Z5112 Encounter for antineoplastic immunotherapy: Secondary | ICD-10-CM

## 2013-04-21 DIAGNOSIS — Z5111 Encounter for antineoplastic chemotherapy: Secondary | ICD-10-CM

## 2013-04-23 DIAGNOSIS — C20 Malignant neoplasm of rectum: Secondary | ICD-10-CM

## 2013-05-11 ENCOUNTER — Encounter (INDEPENDENT_AMBULATORY_CARE_PROVIDER_SITE_OTHER): Payer: Medicare PPO | Admitting: Internal Medicine

## 2013-05-11 DIAGNOSIS — D709 Neutropenia, unspecified: Secondary | ICD-10-CM

## 2013-05-11 DIAGNOSIS — C189 Malignant neoplasm of colon, unspecified: Secondary | ICD-10-CM

## 2013-05-11 DIAGNOSIS — C787 Secondary malignant neoplasm of liver and intrahepatic bile duct: Secondary | ICD-10-CM

## 2013-05-11 DIAGNOSIS — D72819 Decreased white blood cell count, unspecified: Secondary | ICD-10-CM

## 2013-05-12 DIAGNOSIS — C189 Malignant neoplasm of colon, unspecified: Secondary | ICD-10-CM

## 2013-05-12 DIAGNOSIS — C787 Secondary malignant neoplasm of liver and intrahepatic bile duct: Secondary | ICD-10-CM

## 2013-05-12 DIAGNOSIS — Z5112 Encounter for antineoplastic immunotherapy: Secondary | ICD-10-CM

## 2013-05-12 DIAGNOSIS — Z5111 Encounter for antineoplastic chemotherapy: Secondary | ICD-10-CM

## 2013-05-14 DIAGNOSIS — C787 Secondary malignant neoplasm of liver and intrahepatic bile duct: Secondary | ICD-10-CM

## 2013-05-14 DIAGNOSIS — C20 Malignant neoplasm of rectum: Secondary | ICD-10-CM

## 2013-05-18 DIAGNOSIS — C189 Malignant neoplasm of colon, unspecified: Secondary | ICD-10-CM

## 2013-06-02 DIAGNOSIS — Z5111 Encounter for antineoplastic chemotherapy: Secondary | ICD-10-CM

## 2013-06-02 DIAGNOSIS — C259 Malignant neoplasm of pancreas, unspecified: Secondary | ICD-10-CM

## 2013-06-02 DIAGNOSIS — C787 Secondary malignant neoplasm of liver and intrahepatic bile duct: Secondary | ICD-10-CM

## 2013-06-02 DIAGNOSIS — Z5112 Encounter for antineoplastic immunotherapy: Secondary | ICD-10-CM

## 2013-06-04 DIAGNOSIS — C20 Malignant neoplasm of rectum: Secondary | ICD-10-CM

## 2013-06-05 DIAGNOSIS — D702 Other drug-induced agranulocytosis: Secondary | ICD-10-CM

## 2013-06-05 DIAGNOSIS — T451X5A Adverse effect of antineoplastic and immunosuppressive drugs, initial encounter: Secondary | ICD-10-CM

## 2013-06-05 DIAGNOSIS — C20 Malignant neoplasm of rectum: Secondary | ICD-10-CM

## 2013-06-22 ENCOUNTER — Encounter (INDEPENDENT_AMBULATORY_CARE_PROVIDER_SITE_OTHER): Payer: Medicare PPO | Admitting: Internal Medicine

## 2013-06-22 DIAGNOSIS — C787 Secondary malignant neoplasm of liver and intrahepatic bile duct: Secondary | ICD-10-CM

## 2013-06-22 DIAGNOSIS — C189 Malignant neoplasm of colon, unspecified: Secondary | ICD-10-CM

## 2013-06-23 DIAGNOSIS — Z5112 Encounter for antineoplastic immunotherapy: Secondary | ICD-10-CM

## 2013-06-23 DIAGNOSIS — C787 Secondary malignant neoplasm of liver and intrahepatic bile duct: Secondary | ICD-10-CM

## 2013-06-23 DIAGNOSIS — C189 Malignant neoplasm of colon, unspecified: Secondary | ICD-10-CM

## 2013-06-23 DIAGNOSIS — Z5111 Encounter for antineoplastic chemotherapy: Secondary | ICD-10-CM

## 2013-06-25 DIAGNOSIS — C20 Malignant neoplasm of rectum: Secondary | ICD-10-CM

## 2013-06-26 DIAGNOSIS — T451X5A Adverse effect of antineoplastic and immunosuppressive drugs, initial encounter: Secondary | ICD-10-CM

## 2013-06-26 DIAGNOSIS — C20 Malignant neoplasm of rectum: Secondary | ICD-10-CM

## 2013-06-26 DIAGNOSIS — D702 Other drug-induced agranulocytosis: Secondary | ICD-10-CM

## 2013-07-14 DIAGNOSIS — C20 Malignant neoplasm of rectum: Secondary | ICD-10-CM

## 2013-07-14 DIAGNOSIS — Z5112 Encounter for antineoplastic immunotherapy: Secondary | ICD-10-CM

## 2013-08-04 DIAGNOSIS — C20 Malignant neoplasm of rectum: Secondary | ICD-10-CM

## 2013-08-04 DIAGNOSIS — Z5112 Encounter for antineoplastic immunotherapy: Secondary | ICD-10-CM

## 2013-08-25 ENCOUNTER — Encounter (INDEPENDENT_AMBULATORY_CARE_PROVIDER_SITE_OTHER): Payer: Medicare PPO

## 2013-08-25 DIAGNOSIS — C20 Malignant neoplasm of rectum: Secondary | ICD-10-CM

## 2013-08-25 DIAGNOSIS — C787 Secondary malignant neoplasm of liver and intrahepatic bile duct: Secondary | ICD-10-CM

## 2013-08-25 DIAGNOSIS — Z5112 Encounter for antineoplastic immunotherapy: Secondary | ICD-10-CM

## 2013-09-15 DIAGNOSIS — Z5112 Encounter for antineoplastic immunotherapy: Secondary | ICD-10-CM

## 2013-09-15 DIAGNOSIS — C20 Malignant neoplasm of rectum: Secondary | ICD-10-CM

## 2013-09-24 DIAGNOSIS — Z23 Encounter for immunization: Secondary | ICD-10-CM

## 2013-09-24 DIAGNOSIS — C787 Secondary malignant neoplasm of liver and intrahepatic bile duct: Secondary | ICD-10-CM

## 2013-09-24 DIAGNOSIS — C20 Malignant neoplasm of rectum: Secondary | ICD-10-CM

## 2013-10-06 DIAGNOSIS — C20 Malignant neoplasm of rectum: Secondary | ICD-10-CM

## 2013-10-06 DIAGNOSIS — Z5112 Encounter for antineoplastic immunotherapy: Secondary | ICD-10-CM

## 2013-10-22 DIAGNOSIS — D696 Thrombocytopenia, unspecified: Secondary | ICD-10-CM

## 2013-10-22 DIAGNOSIS — C20 Malignant neoplasm of rectum: Secondary | ICD-10-CM

## 2013-10-22 DIAGNOSIS — C787 Secondary malignant neoplasm of liver and intrahepatic bile duct: Secondary | ICD-10-CM

## 2013-10-23 DIAGNOSIS — Z5112 Encounter for antineoplastic immunotherapy: Secondary | ICD-10-CM

## 2013-10-23 DIAGNOSIS — C20 Malignant neoplasm of rectum: Secondary | ICD-10-CM

## 2013-11-02 DIAGNOSIS — Z5112 Encounter for antineoplastic immunotherapy: Secondary | ICD-10-CM

## 2013-11-02 DIAGNOSIS — C189 Malignant neoplasm of colon, unspecified: Secondary | ICD-10-CM

## 2013-11-17 DIAGNOSIS — C20 Malignant neoplasm of rectum: Secondary | ICD-10-CM

## 2013-11-17 DIAGNOSIS — C787 Secondary malignant neoplasm of liver and intrahepatic bile duct: Secondary | ICD-10-CM

## 2013-11-17 DIAGNOSIS — Z5112 Encounter for antineoplastic immunotherapy: Secondary | ICD-10-CM

## 2013-12-01 DIAGNOSIS — C787 Secondary malignant neoplasm of liver and intrahepatic bile duct: Secondary | ICD-10-CM

## 2013-12-01 DIAGNOSIS — R109 Unspecified abdominal pain: Secondary | ICD-10-CM

## 2013-12-01 DIAGNOSIS — R51 Headache: Secondary | ICD-10-CM

## 2013-12-01 DIAGNOSIS — C20 Malignant neoplasm of rectum: Secondary | ICD-10-CM

## 2013-12-01 DIAGNOSIS — Z5112 Encounter for antineoplastic immunotherapy: Secondary | ICD-10-CM

## 2013-12-22 ENCOUNTER — Telehealth: Payer: Self-pay | Admitting: Emergency Medicine

## 2013-12-22 NOTE — Telephone Encounter (Signed)
Called pt to ck status.  He is doing well.  Does not want to f/u here, unless needed.  He will just f/u w/ his physician in [place].   He has another f/u appt w/ [facility] at the end of Dec.

## 2014-11-16 ENCOUNTER — Other Ambulatory Visit (HOSPITAL_COMMUNITY): Payer: Self-pay | Admitting: Oncology

## 2014-11-16 DIAGNOSIS — C189 Malignant neoplasm of colon, unspecified: Secondary | ICD-10-CM

## 2014-12-02 ENCOUNTER — Ambulatory Visit (HOSPITAL_COMMUNITY): Payer: Medicare PPO

## 2014-12-14 ENCOUNTER — Ambulatory Visit (HOSPITAL_COMMUNITY): Payer: Medicare PPO

## 2014-12-20 ENCOUNTER — Ambulatory Visit (HOSPITAL_COMMUNITY)
Admission: RE | Admit: 2014-12-20 | Discharge: 2014-12-20 | Disposition: A | Discharge status: Home / Self Care | Payer: Medicare PPO | Source: Ambulatory Visit | Attending: Oncology | Admitting: Oncology

## 2014-12-20 DIAGNOSIS — C189 Malignant neoplasm of colon, unspecified: Secondary | ICD-10-CM | POA: Diagnosis present

## 2014-12-20 LAB — GLUCOSE, CAPILLARY: GLUCOSE-CAPILLARY: 107 mg/dL — AB (ref 70–99)

## 2014-12-20 MED ORDER — FLUDEOXYGLUCOSE F - 18 (FDG) INJECTION
9.6000 | Freq: Once | INTRAVENOUS | Status: AC | PRN
  Administered 2014-12-20: 9.6 via INTRAVENOUS

## 2014-12-22 ENCOUNTER — Other Ambulatory Visit: Payer: Self-pay | Admitting: Oncology

## 2014-12-22 DIAGNOSIS — C787 Secondary malignant neoplasm of liver and intrahepatic bile duct: Principal | ICD-10-CM

## 2014-12-22 DIAGNOSIS — C189 Malignant neoplasm of colon, unspecified: Secondary | ICD-10-CM

## 2015-01-18 ENCOUNTER — Inpatient Hospital Stay: Admission: RE | Admit: 2015-01-18 | Payer: Medicare PPO | Source: Ambulatory Visit

## 2015-02-08 ENCOUNTER — Ambulatory Visit
Admission: RE | Admit: 2015-02-08 | Discharge: 2015-02-08 | Disposition: A | Discharge status: Home / Self Care | Payer: Medicare HMO | Source: Ambulatory Visit | Attending: Oncology | Admitting: Oncology

## 2015-02-08 ENCOUNTER — Other Ambulatory Visit: Payer: Self-pay | Admitting: Radiology

## 2015-02-08 DIAGNOSIS — C787 Secondary malignant neoplasm of liver and intrahepatic bile duct: Principal | ICD-10-CM

## 2015-02-08 DIAGNOSIS — C189 Malignant neoplasm of colon, unspecified: Secondary | ICD-10-CM

## 2015-02-08 HISTORY — PX: IR GENERIC HISTORICAL: IMG1180011

## 2015-02-08 NOTE — Progress Notes (Signed)
Patient ID: [name] Fields, male   DOB: 10-26-1936, 79 y.o.   MRN: [id number]   Chief Complaint: Chief Complaint  Patient presents with  . Follow-up    for Y-90 SIRT    Referring Physician(s): Neijstrom,Eric S  History of Present Illness: [name] Fields is a 79 y.o. male with colon cancer metastatic to the liver who underwent Y90 radio-embolization for bilateral liver disease, in December 2014. He is two years out and doing well. He is about 10 pounds heavier than his pretreatment weight and has an excellent appetite. He has been treated with systemic chemotherapy, but recently has started to develop neuropathy. His recent CEA was 16.6 in 12/14/2014. A CT at that time showed the the left lobe lesion was smaller and the right lobe lesion was stable in size. The accompanying PET show no activity in the left lesion and some mild activity at the periphery of the right lobe lesion.  Past Medical History  Diagnosis Date  . Colon cancer   . Arthritis     Past Surgical History  Procedure Laterality Date  . Colon surgery      jan 2013    Allergies: Review of patient's allergies indicates no known allergies.  Medications: Prior to Admission medications   Medication Sig Start Date End Date Taking? Authorizing Provider  HYDROcodone-acetaminophen (NORCO/VICODIN) 5-325 MG per tablet Take 1 tablet by mouth Every 6 hours as needed. For pain 09/25/12  Yes Historical Provider, MD  omeprazole (PRILOSEC) 10 MG capsule Take 1 capsule (10 mg total) by mouth daily. 12/17/12  Yes Art A Annaleigha Woo, MD    No family history on file.  History   Social History  . Marital Status: Widowed    Spouse Name: N/A    Number of Children: N/A  . Years of Education: N/A   Social History Main Topics  . Smoking status: Current Every Day Smoker -- 1.00 packs/day for 50 years    Types: Cigarettes  . Smokeless tobacco: Not on file  . Alcohol Use: 3.5 oz/week    7 drink(s) per week     Comment: 2-3 times/week  . Drug  Use: No  . Sexual Activity: Not on file   Other Topics Concern  . Not on file   Social History Narrative  . No narrative on file    ECOG Status: 0 - Asymptomatic  Review of Systems: A 12 point ROS discussed and pertinent positives are indicated in the HPI above.  All other systems are negative.  Review of Systems  Vital Signs: BP 114/65 mmHg  Pulse 64  Temp(Src) 98.2 F (36.8 C) (Oral)  Resp 14  Ht 5\' 11"  (1.803 m)  Wt 154 lb (69.854 kg)  BMI 21.49 kg/m2  SpO2 97%  Physical Exam  Constitutional: He is oriented to person, place, and time. He appears well-developed and well-nourished.  HENT:  Head: Normocephalic and atraumatic.  Musculoskeletal: Normal range of motion.  Neurological: He is alert and oriented to person, place, and time.  Skin: Skin is warm and dry.    Imaging: No results found.  Labs:  CBC: No results for input(s): WBC, HGB, HCT, PLT in the last 8760 hours.  COAGS: No results for input(s): INR, APTT in the last 8760 hours.  BMP: No results for input(s): NA, K, CL, CO2, GLUCOSE, BUN, CALCIUM, CREATININE, GFRNONAA, GFRAA in the last 8760 hours.  Invalid input(s): CMP  LIVER FUNCTION TESTS: Hb13.4 WBC 10.6 PLT 105 TBILI .7 ALC PHOS 250 LFT otherwise normal GFR  NL   TUMOR MARKERS: No results for input(s): AFPTM, CEA, CA199, CHROMGRNA in the last 8760 hours.  Assessment and Plan:  Mr. Winkles has done very well with treatment. He underwent bilateral liver Y90 therapy two years ago and now has minimal activity in the right lobe. He is a candidate for repeat Y90 treatment in the right lobe only. He will need Y90 angio mapping followed by right lobe treatment. He consents and his questions were answered.   Signed: Habib Kise, ART A 02/08/2015, 11:26 AM   I spent a total of 30 minutes face to face in clinical consultation, greater than 50% of which was counseling/coordinating care for Y90 therapy.

## 2015-02-15 ENCOUNTER — Other Ambulatory Visit (HOSPITAL_COMMUNITY): Payer: Self-pay | Admitting: Interventional Radiology

## 2015-02-15 ENCOUNTER — Telehealth: Payer: Self-pay | Admitting: Emergency Medicine

## 2015-02-15 DIAGNOSIS — C189 Malignant neoplasm of colon, unspecified: Secondary | ICD-10-CM

## 2015-02-15 DIAGNOSIS — C787 Secondary malignant neoplasm of liver and intrahepatic bile duct: Principal | ICD-10-CM

## 2015-02-15 NOTE — Telephone Encounter (Signed)
CALLED PT TO LET HIM KNOW THAT THE Y-90 WAS APPROVED BY HIS INSURANCE. TIFFANY WILL CALL HIM WITH HIS TREATMENT DATES.

## 2015-02-21 ENCOUNTER — Other Ambulatory Visit: Payer: Self-pay | Admitting: Radiology

## 2015-02-23 ENCOUNTER — Ambulatory Visit (HOSPITAL_COMMUNITY)
Admission: RE | Admit: 2015-02-23 | Discharge: 2015-02-23 | Disposition: A | Discharge status: Home / Self Care | Payer: Medicare HMO | Source: Ambulatory Visit | Attending: Interventional Radiology | Admitting: Interventional Radiology

## 2015-02-23 ENCOUNTER — Encounter (HOSPITAL_COMMUNITY)
Admission: RE | Admit: 2015-02-23 | Discharge: 2015-02-23 | Disposition: A | Discharge status: Home / Self Care | Payer: Medicare HMO | Source: Ambulatory Visit | Attending: Interventional Radiology | Admitting: Interventional Radiology

## 2015-02-23 ENCOUNTER — Other Ambulatory Visit (HOSPITAL_COMMUNITY): Payer: Self-pay | Admitting: Interventional Radiology

## 2015-02-23 ENCOUNTER — Encounter (HOSPITAL_COMMUNITY): Payer: Self-pay

## 2015-02-23 DIAGNOSIS — C189 Malignant neoplasm of colon, unspecified: Secondary | ICD-10-CM

## 2015-02-23 DIAGNOSIS — F17210 Nicotine dependence, cigarettes, uncomplicated: Secondary | ICD-10-CM | POA: Insufficient documentation

## 2015-02-23 DIAGNOSIS — Z79899 Other long term (current) drug therapy: Secondary | ICD-10-CM | POA: Insufficient documentation

## 2015-02-23 DIAGNOSIS — C787 Secondary malignant neoplasm of liver and intrahepatic bile duct: Secondary | ICD-10-CM | POA: Diagnosis not present

## 2015-02-23 DIAGNOSIS — G629 Polyneuropathy, unspecified: Secondary | ICD-10-CM | POA: Diagnosis not present

## 2015-02-23 LAB — CBC WITH DIFFERENTIAL/PLATELET
BASOS ABS: 0.1 10*3/uL (ref 0.0–0.1)
Basophils Relative: 1 % (ref 0–1)
Eosinophils Absolute: 0.1 10*3/uL (ref 0.0–0.7)
Eosinophils Relative: 2 % (ref 0–5)
HCT: 35.4 % — ABNORMAL LOW (ref 39.0–52.0)
Hemoglobin: 11.5 g/dL — ABNORMAL LOW (ref 13.0–17.0)
LYMPHS PCT: 24 % (ref 12–46)
Lymphs Abs: 1 10*3/uL (ref 0.7–4.0)
MCH: 32.4 pg (ref 26.0–34.0)
MCHC: 32.5 g/dL (ref 30.0–36.0)
MCV: 99.7 fL (ref 78.0–100.0)
Monocytes Absolute: 0.7 10*3/uL (ref 0.1–1.0)
Monocytes Relative: 16 % — ABNORMAL HIGH (ref 3–12)
NEUTROS PCT: 57 % (ref 43–77)
Neutro Abs: 2.6 10*3/uL (ref 1.7–7.7)
PLATELETS: 131 10*3/uL — AB (ref 150–400)
RBC: 3.55 MIL/uL — ABNORMAL LOW (ref 4.22–5.81)
RDW: 13.4 % (ref 11.5–15.5)
WBC: 4.4 10*3/uL (ref 4.0–10.5)

## 2015-02-23 LAB — COMPREHENSIVE METABOLIC PANEL
ALBUMIN: 3.9 g/dL (ref 3.5–5.2)
ALK PHOS: 176 U/L — AB (ref 39–117)
ALT: 17 U/L (ref 0–53)
ANION GAP: 5 (ref 5–15)
AST: 22 U/L (ref 0–37)
BUN: 12 mg/dL (ref 6–23)
CALCIUM: 8.8 mg/dL (ref 8.4–10.5)
CO2: 27 mmol/L (ref 19–32)
Chloride: 107 mmol/L (ref 96–112)
Creatinine, Ser: 0.77 mg/dL (ref 0.50–1.35)
GFR calc non Af Amer: 86 mL/min — ABNORMAL LOW (ref >90)
Glucose, Bld: 116 mg/dL — ABNORMAL HIGH (ref 70–99)
POTASSIUM: 4.1 mmol/L (ref 3.5–5.1)
SODIUM: 139 mmol/L (ref 135–145)
TOTAL PROTEIN: 7.3 g/dL (ref 6.0–8.3)
Total Bilirubin: 0.4 mg/dL (ref 0.3–1.2)

## 2015-02-23 LAB — APTT: aPTT: 33 s (ref 24–37)

## 2015-02-23 LAB — PROTIME-INR
INR: 1.03 (ref 0.00–1.49)
Prothrombin Time: 13.7 s (ref 11.6–15.2)

## 2015-02-23 MED ORDER — FENTANYL CITRATE 0.05 MG/ML IJ SOLN
INTRAMUSCULAR | Status: AC | PRN
  Administered 2015-02-23: 50 ug via INTRAVENOUS
  Administered 2015-02-23: 25 ug via INTRAVENOUS

## 2015-02-23 MED ORDER — LIDOCAINE HCL 1 % IJ SOLN
INTRAMUSCULAR | Status: AC
  Filled 2015-02-23: qty 20

## 2015-02-23 MED ORDER — MIDAZOLAM HCL 2 MG/2ML IJ SOLN
INTRAMUSCULAR | Status: AC
Start: 2015-02-23 — End: 2015-02-23
  Filled 2015-02-23: qty 6

## 2015-02-23 MED ORDER — HEPARIN SOD (PORK) LOCK FLUSH 100 UNIT/ML IV SOLN
500.0000 [IU] | Freq: Once | INTRAVENOUS | Status: AC
  Administered 2015-02-23: 500 [IU] via INTRAVENOUS
  Filled 2015-02-23: qty 5

## 2015-02-23 MED ORDER — TECHNETIUM TO 99M ALBUMIN AGGREGATED: 5.0000 | Freq: Once | INTRAVENOUS | Status: AC | PRN

## 2015-02-23 MED ORDER — FENTANYL CITRATE 0.05 MG/ML IJ SOLN
INTRAMUSCULAR | Status: AC
  Filled 2015-02-23: qty 4

## 2015-02-23 MED ORDER — IOHEXOL 300 MG/ML  SOLN
80.0000 mL | Freq: Once | INTRAMUSCULAR | Status: AC | PRN
Start: 2015-02-23 — End: 2015-02-23
  Administered 2015-02-23: 80 mL via INTRA_ARTERIAL

## 2015-02-23 MED ORDER — MIDAZOLAM HCL 2 MG/2ML IJ SOLN
INTRAMUSCULAR | Status: AC | PRN
  Administered 2015-02-23 (×2): 1 mg via INTRAVENOUS

## 2015-02-23 MED ORDER — SODIUM CHLORIDE 0.9 % IV SOLN
INTRAVENOUS | Status: DC
  Administered 2015-02-23: 08:00:00 via INTRAVENOUS

## 2015-02-23 NOTE — Sedation Documentation (Signed)
66fr. Sheath removed by Dr. Barbie Banner. Hemostasis achieved using manual pressure. Groin level 0. RDP +1.

## 2015-02-23 NOTE — H&P (Signed)
Chief Complaint: "I'm having another treatment of my liver"  Referring Physician(s): Dr. Jacquiline Doe  History of Present Illness: [name] Fields is a 79 y.o. male  with colon cancer metastatic to the liver who underwent Y90 radio-embolization for bilateral liver disease, in December 2014. He is two years out and doing well. He is about 10 pounds heavier than his pretreatment weight and has an excellent appetite. He has been treated with systemic chemotherapy, but recently has started to develop neuropathy. His recent CEA was 16.6 in 12/14/2014. A CT at that time showed the the left lobe lesion was smaller and the right lobe lesion was stable in size. The accompanying PET showed no activity in the left lesion and some mild activity at the periphery of the right lobe lesion. He presents today following recent IR evaluation for repeat hepatic/visceral arteriogram with embolization /test Y-90 dosing prior to Y-90 right hepatic radioembolization.   Past Medical History  Diagnosis Date  . Colon cancer   . Arthritis     Past Surgical History  Procedure Laterality Date  . Colon surgery      jan 2013  . Power port Right 2013    Allergies: Review of patient's allergies indicates no known allergies.  Medications: Prior to Admission medications   Medication Sig Start Date End Date Taking? Authorizing Provider  diphenoxylate-atropine (LOMOTIL) 2.5-0.025 MG per tablet Take 2 tablets by mouth 4 (four) times daily as needed for diarrhea or loose stools (Do not take more than 8 tablets per day.).  03/03/14  Yes Historical Provider, MD  HYDROcodone-acetaminophen (NORCO/VICODIN) 5-325 MG per tablet Take 1 tablet by mouth every 6 (six) hours as needed (For pain.).  09/25/12  Yes Historical Provider, MD  naphazoline-pheniramine (NAPHCON-A) 0.025-0.3 % ophthalmic solution Place 2 drops into both eyes 4 (four) times daily as needed for irritation or allergies.    Yes Historical Provider, MD  omeprazole  (PRILOSEC) 10 MG capsule Take 1 capsule (10 mg total) by mouth daily. Patient taking differently: Take 10 mg by mouth daily as needed (For heartburn or acid reflux.).  12/17/12  Yes Art A Hoss, MD  omeprazole (PRILOSEC) 20 MG capsule Take 20 mg by mouth daily as needed (For heartburn or acid reflux.).  04/06/14  Yes Historical Provider, MD  prochlorperazine (COMPAZINE) 10 MG tablet Take 10 mg by mouth every 6 (six) hours as needed for nausea or vomiting.  03/12/14  Yes Historical Provider, MD  zolpidem (AMBIEN) 5 MG tablet Take 5 mg by mouth at bedtime.  11/17/14  Yes Historical Provider, MD  acetaminophen (TYLENOL) 500 MG tablet Take 1,000 mg by mouth every 6 (six) hours as needed (For pain.).     Historical Provider, MD    History reviewed. No pertinent family history.  History   Social History  . Marital Status: Widowed    Spouse Name: N/A  . Number of Children: N/A  . Years of Education: N/A   Social History Main Topics  . Smoking status: Current Every Day Smoker -- 1.00 packs/day for 50 years    Types: Cigarettes  . Smokeless tobacco: Not on file  . Alcohol Use: 3.5 oz/week    7 Standard drinks or equivalent per week     Comment: 2-3 times/week  . Drug Use: No  . Sexual Activity: Not on file   Other Topics Concern  . None   Social History Narrative      Review of Systems   Constitutional: Negative for fever and chills.  Respiratory: Negative for shortness of breath.        Occ cough  Cardiovascular: Negative for chest pain.  Gastrointestinal: Negative for nausea, vomiting, abdominal pain and blood in stool.  Genitourinary: Negative for dysuria and hematuria.  Musculoskeletal: Negative for back pain.  Neurological: Negative for headaches.    Vital Signs: BP 119/69 mmHg  Pulse 64  Temp(Src) 97.6 F (36.4 C) (Oral)  Resp 16  SpO2 98%  Physical Exam  Constitutional: He is oriented to person, place, and time. He appears well-developed and well-nourished.    Cardiovascular: Normal rate and regular rhythm.   Clean, intact rt chest wall PAC   Pulmonary/Chest: Effort normal.  Dim BS rt base, left clear  Abdominal: Soft. Bowel sounds are normal. There is no tenderness.  Musculoskeletal: Normal range of motion. He exhibits no edema.  Neurological: He is alert and oriented to person, place, and time.    Imaging: No results found.  Labs:  CBC:  Recent Labs  02/23/15 0810  WBC 4.4  HGB 11.5*  HCT 35.4*  PLT 131*    COAGS:  Recent Labs  02/23/15 0810  INR 1.03  APTT 33    BMP: No results for input(s): NA, K, CL, CO2, GLUCOSE, BUN, CALCIUM, CREATININE, GFRNONAA, GFRAA in the last 8760 hours.  Invalid input(s): CMP  LIVER FUNCTION TESTS: No results for input(s): BILITOT, AST, ALT, ALKPHOS, PROT, ALBUMIN in the last 8760 hours.  TUMOR MARKERS: No results for input(s): AFPTM, CEA, CA199, CHROMGRNA in the last 8760 hours.  Assessment and Plan: [name] Fields is a 79 y.o. male  with colon cancer metastatic to the liver who underwent Y90 radio-embolization for bilateral liver disease, in December 2014. He is two years out and doing well. He is about 10 pounds heavier than his pretreatment weight and has an excellent appetite. He has been treated with systemic chemotherapy, but recently has started to develop neuropathy. His recent CEA was 16.6 in 12/14/2014. A CT at that time showed the the left lobe lesion was smaller and the right lobe lesion was stable in size. The accompanying PET showed no activity in the left lesion and some mild activity at the periphery of the right lobe lesion. He presents today following recent IR evaluation for repeat hepatic/visceral arteriogram with embolization /test Y-90 dosing prior to Y-90 right hepatic radioembolization.Details/risks of procedure d/w pt/son with their understanding and consent.      Signed: Autumn Messing 02/23/2015, 8:59 AM   I spent a total of 15 minutes face to face in  clinical consultation, greater than 50% of which was counseling/coordinating care for pre Y-90 hepatic/visceral  arteriogram with embolization/Y-90 test dosing

## 2015-02-23 NOTE — Discharge Instructions (Signed)
Angiogram An angiogram, also called angiography, is a procedure used to look at the blood vessels that carry blood to different parts of your body (arteries). In this procedure, dye is injected through a long, thin tube (catheter) into an artery. X-rays are then taken. The X-rays will show if there is a blockage or problem in a blood vessel.  LET [facility] CARE PROVIDER KNOW ABOUT:  Any allergies you have, including allergies to shellfish or contrast dye.   All medicines you are taking, including vitamins, herbs, eye drops, creams, and over-the-counter medicines.   Previous problems you or members of your family have had with the use of anesthetics.   Any blood disorders you have.   Previous surgeries you have had.  Any previous kidney problems or failure you have had.  Medical conditions you have.   Possibility of pregnancy, if this applies. RISKS AND COMPLICATIONS Generally, an angiogram is a safe procedure. However, as with any procedure, problems can occur. Possible problems include:  Injury to the blood vessels, including rupture or bleeding.  Infection or bruising at the catheter site.  Allergic reaction to the dye or contrast used.  Kidney damage from the dye or contrast used.  Blood clots that can lead to a stroke or heart attack. BEFORE THE PROCEDURE  Do not eat or drink after midnight on the night before the procedure, or as directed by your health care provider.   Ask your health care provider if you may drink enough water to take any needed medicines the morning of the procedure.  PROCEDURE  You may be given a medicine to help you relax (sedative) before and during the procedure. This medicine is given through an IV access tube that is inserted into one of your veins.   The area where the catheter will be inserted will be washed and shaved. This is usually done in the groin but may be done in the fold of your arm (near your elbow) or in the wrist.  A  medicine will be given to numb the area where the catheter will be inserted (local anesthetic).  The catheter will be inserted with a guide wire into an artery. The catheter is guided by using a type of X-ray (fluoroscopy) to the blood vessel being examined.   Dye is then injected into the catheter, and X-rays are taken. The dye helps to show where any narrowing or blockages are located.  AFTER THE PROCEDURE   If the procedure is done through the leg, you will be kept in bed lying flat for several hours. You will be instructed to not bend or cross your legs.  The insertion site will be checked frequently.  The pulse in your feet or wrist will be checked frequently.  Additional blood tests, X-rays, and electrocardiography may be done.   You may need to stay in the hospital overnight for observation.  Document Released: 09/26/2005 Document Revised: 12/22/2013 Document Reviewed: 05/20/2013 [facility] Patient Information 2015 [place], [place]. This information is not intended to replace advice given to you by your health care provider. Make sure you discuss any questions you have with your health care provider. Angiogram, Care After Refer to this sheet in the next few weeks. These instructions provide you with information on caring for yourself after your procedure. Your health care provider may also give you more specific instructions. Your treatment has been planned according to current medical practices, but problems sometimes occur. Call your health care provider if you have any problems  or questions after your procedure.  WHAT TO EXPECT AFTER THE PROCEDURE After your procedure, it is typical to have the following sensations:  Minor discomfort or tenderness and a small bump at the catheter insertion site. The bump should usually decrease in size and tenderness within 1 to 2 weeks.  Any bruising will usually fade within 2 to 4 weeks. HOME CARE INSTRUCTIONS   You may need to keep taking  blood thinners if they were prescribed for you. Take medicines only as directed by your health care provider.  Do not apply powder or lotion to the site.  Do not take baths, swim, or use a hot tub until your health care provider approves.  You may shower 24 hours after the procedure. Remove the bandage (dressing) and gently wash the site with plain soap and water. Gently pat the site dry.  Inspect the site at least twice daily.  Limit your activity for the first 48 hours. Do not bend, squat, or lift anything over 20 lb (9 kg) or as directed by your health care provider.  Plan to have someone take you home after the procedure. Follow instructions about when you can drive or return to work. SEEK MEDICAL CARE IF:  You get light-headed when standing up.  You have drainage (other than a small amount of blood on the dressing).  You have chills.  You have a fever.  You have redness, warmth, swelling, or pain at the insertion site. SEEK IMMEDIATE MEDICAL CARE IF:   You develop chest pain or shortness of breath, feel faint, or pass out.  You have bleeding, swelling larger than a walnut, or drainage from the catheter insertion site.  You develop pain, discoloration, coldness, or severe bruising in the leg or arm that held the catheter.  You develop bleeding from any other place, such as the bowels. You may see bright red blood in your urine or stools, or your stools may appear black and tarry.  You have heavy bleeding from the site. If this happens, hold pressure on the site. MAKE SURE YOU:  Understand these instructions.  Will watch your condition.  Will get help right away if you are not doing well or get worse. Document Released: 07/05/2005 Document Revised: 05/03/2014 Document Reviewed: 05/11/2013 [facility] Patient Information 2015 [place], [place]. This information is not intended to replace advice given to you by your health care provider. Make sure you discuss any questions you  have with your health care provider. Conscious Sedation Sedation is the use of medicines to promote relaxation and relieve discomfort and anxiety. Conscious sedation is a type of sedation. Under conscious sedation you are less alert than normal but are still able to respond to instructions or stimulation. Conscious sedation is used during short medical and dental procedures. It is milder than deep sedation or general anesthesia and allows you to return to your regular activities sooner.  LET [facility] CARE PROVIDER KNOW ABOUT:   Any allergies you have.  All medicines you are taking, including vitamins, herbs, eye drops, creams, and over-the-counter medicines.  Use of steroids (by mouth or creams).  Previous problems you or members of your family have had with the use of anesthetics.  Any blood disorders you have.  Previous surgeries you have had.  Medical conditions you have.  Possibility of pregnancy, if this applies.  Use of cigarettes, alcohol, or illegal drugs. RISKS AND COMPLICATIONS Generally, this is a safe procedure. However, as with any procedure, problems can occur. Possible problems  include:  Oversedation.  Trouble breathing on your own. You may need to have a breathing tube until you are awake and breathing on your own.  Allergic reaction to any of the medicines used for the procedure. BEFORE THE PROCEDURE  You may have blood tests done. These tests can help show how well your kidneys and liver are working. They can also show how well your blood clots.  A physical exam will be done.  Only take medicines as directed by your health care provider. You may need to stop taking medicines (such as blood thinners, aspirin, or nonsteroidal anti-inflammatory drugs) before the procedure.   Do not eat or drink at least 6 hours before the procedure or as directed by your health care provider.  Arrange for a responsible adult, family member, or friend to take you home after  the procedure. He or she should stay with you for at least 24 hours after the procedure, until the medicine has worn off. PROCEDURE   An intravenous (IV) catheter will be inserted into one of your veins. Medicine will be able to flow directly into your body through this catheter. You may be given medicine through this tube to help prevent pain and help you relax.  The medical or dental procedure will be done. AFTER THE PROCEDURE  You will stay in a recovery area until the medicine has worn off. Your blood pressure and pulse will be checked.   Depending on the procedure you had, you may be allowed to go home when you can tolerate liquids and your pain is under control. Document Released: 09/11/2001 Document Revised: 12/22/2013 Document Reviewed: 08/24/2013 [facility] Patient Information 2015 [place], [place]. This information is not intended to replace advice given to you by your health care provider. Make sure you discuss any questions you have with your health care provider.

## 2015-02-23 NOTE — Procedures (Signed)
Pre Y90 No new coils [id number] injected into the R lobe. No comp

## 2015-03-03 LAB — COMPREHENSIVE METABOLIC PANEL
ALBUMIN: 4 g/dL (ref 3.5–5.2)
ALK PHOS: 104 U/L (ref 39–117)
ALT: 13 U/L (ref 0–53)
AST: 18 U/L (ref 0–37)
BILIRUBIN TOTAL: 0.3 mg/dL (ref 0.2–1.2)
BUN: 13 mg/dL (ref 6–23)
CO2: 28 mEq/L (ref 19–32)
CREATININE: 1.29 mg/dL (ref 0.50–1.35)
Calcium: 9.1 mg/dL (ref 8.4–10.5)
Chloride: 104 mEq/L (ref 96–112)
Glucose, Bld: 108 mg/dL — ABNORMAL HIGH (ref 70–99)
Potassium: 4.3 mEq/L (ref 3.5–5.3)
SODIUM: 142 meq/L (ref 135–145)
Total Protein: 6.5 g/dL (ref 6.0–8.3)

## 2015-03-03 LAB — CBC
HCT: 41.4 % (ref 39.0–52.0)
Hemoglobin: 13.6 g/dL (ref 13.0–17.0)
MCH: 29.4 pg (ref 26.0–34.0)
MCHC: 32.9 g/dL (ref 30.0–36.0)
MCV: 89.4 fL (ref 78.0–100.0)
MPV: 11.2 fL (ref 8.6–12.4)
PLATELETS: 166 10*3/uL (ref 150–400)
RBC: 4.63 MIL/uL (ref 4.22–5.81)
RDW: 14.5 % (ref 11.5–15.5)
WBC: 2.8 10*3/uL — ABNORMAL LOW (ref 4.0–10.5)

## 2015-03-07 ENCOUNTER — Other Ambulatory Visit: Payer: Self-pay | Admitting: Radiology

## 2015-03-08 ENCOUNTER — Other Ambulatory Visit: Payer: Self-pay

## 2015-03-08 ENCOUNTER — Encounter (HOSPITAL_COMMUNITY): Payer: Self-pay

## 2015-03-08 ENCOUNTER — Ambulatory Visit (HOSPITAL_COMMUNITY)
Admission: RE | Admit: 2015-03-08 | Discharge: 2015-03-08 | Disposition: A | Discharge status: Home / Self Care | Payer: Medicare HMO | Source: Ambulatory Visit | Attending: Interventional Radiology | Admitting: Interventional Radiology

## 2015-03-08 ENCOUNTER — Encounter (HOSPITAL_COMMUNITY)
Admission: RE | Admit: 2015-03-08 | Discharge: 2015-03-08 | Disposition: A | Discharge status: Home / Self Care | Payer: Medicare HMO | Source: Ambulatory Visit | Attending: Interventional Radiology | Admitting: Interventional Radiology

## 2015-03-08 ENCOUNTER — Other Ambulatory Visit (HOSPITAL_COMMUNITY): Payer: Self-pay | Admitting: Interventional Radiology

## 2015-03-08 DIAGNOSIS — C787 Secondary malignant neoplasm of liver and intrahepatic bile duct: Secondary | ICD-10-CM | POA: Insufficient documentation

## 2015-03-08 DIAGNOSIS — C189 Malignant neoplasm of colon, unspecified: Secondary | ICD-10-CM

## 2015-03-08 LAB — CBC WITH DIFFERENTIAL/PLATELET
BASOS PCT: 1 % (ref 0–1)
Basophils Absolute: 0 10*3/uL (ref 0.0–0.1)
Eosinophils Absolute: 0.2 10*3/uL (ref 0.0–0.7)
Eosinophils Relative: 3 % (ref 0–5)
HCT: 36.9 % — ABNORMAL LOW (ref 39.0–52.0)
Hemoglobin: 12.1 g/dL — ABNORMAL LOW (ref 13.0–17.0)
LYMPHS ABS: 1 10*3/uL (ref 0.7–4.0)
Lymphocytes Relative: 19 % (ref 12–46)
MCH: 32.4 pg (ref 26.0–34.0)
MCHC: 32.8 g/dL (ref 30.0–36.0)
MCV: 98.7 fL (ref 78.0–100.0)
MONOS PCT: 11 % (ref 3–12)
Monocytes Absolute: 0.6 10*3/uL (ref 0.1–1.0)
Neutro Abs: 3.5 10*3/uL (ref 1.7–7.7)
Neutrophils Relative %: 66 % (ref 43–77)
Platelets: 142 10*3/uL — ABNORMAL LOW (ref 150–400)
RBC: 3.74 MIL/uL — ABNORMAL LOW (ref 4.22–5.81)
RDW: 13 % (ref 11.5–15.5)
WBC: 5.3 10*3/uL (ref 4.0–10.5)

## 2015-03-08 LAB — PROTIME-INR
INR: 1.05 (ref 0.00–1.49)
PROTHROMBIN TIME: 13.8 s (ref 11.6–15.2)

## 2015-03-08 LAB — COMPREHENSIVE METABOLIC PANEL
ALBUMIN: 4 g/dL (ref 3.5–5.2)
ALT: 16 U/L (ref 0–53)
AST: 22 U/L (ref 0–37)
Alkaline Phosphatase: 175 U/L — ABNORMAL HIGH (ref 39–117)
Anion gap: 1 — ABNORMAL LOW (ref 5–15)
BILIRUBIN TOTAL: 0.7 mg/dL (ref 0.3–1.2)
BUN: 11 mg/dL (ref 6–23)
CHLORIDE: 107 mmol/L (ref 96–112)
CO2: 27 mmol/L (ref 19–32)
CREATININE: 0.82 mg/dL (ref 0.50–1.35)
Calcium: 9 mg/dL (ref 8.4–10.5)
GFR calc Af Amer: >90 mL/min (ref >90)
GFR calc non Af Amer: 84 mL/min — ABNORMAL LOW (ref >90)
Glucose, Bld: 122 mg/dL — ABNORMAL HIGH (ref 70–99)
Potassium: 4.5 mmol/L (ref 3.5–5.1)
Sodium: 135 mmol/L (ref 135–145)
Total Protein: 7.6 g/dL (ref 6.0–8.3)

## 2015-03-08 LAB — APTT: aPTT: 32 seconds (ref 24–37)

## 2015-03-08 LAB — CK TOTAL AND CKMB (NOT AT ARMC)
CK TOTAL: 67 U/L (ref 7–232)
CK, MB: 1.5 ng/mL (ref 0.3–4.0)
RELATIVE INDEX: INVALID (ref 0.0–2.5)

## 2015-03-08 LAB — TROPONIN I: Troponin I: <0.03 ng/mL (ref <0.031)

## 2015-03-08 MED ORDER — ATROPINE SULFATE 0.1 MG/ML IJ SOLN
INTRAMUSCULAR | Status: AC
  Filled 2015-03-08: qty 10

## 2015-03-08 MED ORDER — CEFAZOLIN SODIUM-DEXTROSE 2-3 GM-% IV SOLR
INTRAVENOUS | Status: AC
  Filled 2015-03-08: qty 50

## 2015-03-08 MED ORDER — ATROPINE SULFATE 0.1 MG/ML IJ SOLN
INTRAMUSCULAR | Status: AC | PRN
  Administered 2015-03-08: 1 mg via INTRAVENOUS

## 2015-03-08 MED ORDER — ONDANSETRON HCL 4 MG/2ML IJ SOLN
4.0000 mg | Freq: Once | INTRAMUSCULAR | Status: AC
  Administered 2015-03-08: 4 mg via INTRAVENOUS
  Filled 2015-03-08: qty 2

## 2015-03-08 MED ORDER — SODIUM CHLORIDE 0.9 % IV SOLN
INTRAVENOUS | Status: DC
  Administered 2015-03-08: 08:00:00 via INTRAVENOUS

## 2015-03-08 MED ORDER — MIDAZOLAM HCL 2 MG/2ML IJ SOLN
INTRAMUSCULAR | Status: AC | PRN
Start: 2015-03-08 — End: 2015-03-08
  Administered 2015-03-08: 1 mg via INTRAVENOUS

## 2015-03-08 MED ORDER — FENTANYL CITRATE 0.05 MG/ML IJ SOLN
INTRAMUSCULAR | Status: AC
  Filled 2015-03-08: qty 4

## 2015-03-08 MED ORDER — LIDOCAINE HCL 1 % IJ SOLN
INTRAMUSCULAR | Status: AC
  Filled 2015-03-08: qty 20

## 2015-03-08 MED ORDER — PANTOPRAZOLE SODIUM 40 MG IV SOLR
40.0000 mg | Freq: Once | INTRAVENOUS | Status: AC
  Administered 2015-03-08: 40 mg via INTRAVENOUS
  Filled 2015-03-08: qty 40

## 2015-03-08 MED ORDER — MIDAZOLAM HCL 2 MG/2ML IJ SOLN
INTRAMUSCULAR | Status: AC
  Filled 2015-03-08: qty 6

## 2015-03-08 MED ORDER — HEPARIN SOD (PORK) LOCK FLUSH 100 UNIT/ML IV SOLN
500.0000 [IU] | INTRAVENOUS | Status: AC | PRN
  Administered 2015-03-08: 500 [IU]
  Filled 2015-03-08: qty 5

## 2015-03-08 MED ORDER — HYDROCODONE-ACETAMINOPHEN 5-325 MG PO TABS
1.0000 | ORAL_TABLET | ORAL | Status: DC | PRN
Start: 2015-03-08 — End: 2015-03-09
  Administered 2015-03-08: 1 via ORAL
  Filled 2015-03-08: qty 1

## 2015-03-08 MED ORDER — IOHEXOL 300 MG/ML  SOLN
50.0000 mL | Freq: Once | INTRAMUSCULAR | Status: AC | PRN
  Administered 2015-03-08: 58 mL via INTRA_ARTERIAL

## 2015-03-08 MED ORDER — CEFAZOLIN SODIUM-DEXTROSE 2-3 GM-% IV SOLR
2.0000 g | Freq: Once | INTRAVENOUS | Status: AC
  Administered 2015-03-08: 2 g via INTRAVENOUS

## 2015-03-08 MED ORDER — FENTANYL CITRATE 0.05 MG/ML IJ SOLN
INTRAMUSCULAR | Status: AC | PRN
  Administered 2015-03-08 (×2): 25 ug via INTRAVENOUS

## 2015-03-08 NOTE — Progress Notes (Signed)
Late entry from 1500. Patient ready for discharge to home from short stay postY-90. Troponin and cardiac enzymes drawn post y-90 in interventional radiology. Troponin within normal limits and Dr. Barbie Banner aware. Ckmb not resulted prior to patient's ordered discharge time. Dr. Barbie Banner aware and stated patient may be d/c home at 1500 as ordered. Patient's abdominal pain lessened with Vicodin. VSS. All discharge instructions reviewed with patient and family and verbalized understanding. (Ckmb since resulted and within normal limits).

## 2015-03-08 NOTE — Sedation Documentation (Signed)
5Fr sheath removed from R fem artery by Dr. Barbie Banner. Hemostasis achieved by exoseal at 1100. Manual pressure held by Lambert Mody for 5 minutes. Groin level 0. RDP +3.

## 2015-03-08 NOTE — Discharge Instructions (Signed)
Post Y-90 Radioembolization Discharge Instructions  You have been given a radioactive material during your procedure.  While it is safe for you to be discharged home from the hospital, you need to proceed directly home.    Do not use public transportation, including air travel, lasting more than 2 hours for 1 week.  Avoid crowded public places for 1 week.  Adult visitors should try to avoid close contact with you for 1 week.    Children and pregnant females should not visit or have close contact with you for 1 week.  Items that you touch are not radioactive.  Do not sleep in the same bed as your partner for 1 week, and a condom should be used for sexual activity during the first 24 hours.  Your blood may be radioactive and caution should be used if any bleeding occurs during the recovery period.  Body fluids may be radioactive for 24 hours.  Wash your hands after voiding.  Men should sit to urinate.  Dispose of any soiled materials (flush down toilet or place in trash at home) during the first day.  Drink 6 to 8 glasses of fluids per day for 5 days to hydrate yourself.  If you need to see a doctor during the first week, you must let them know that you were treated with yttrium-90 microspheres, and will be slightly radioactive.  They can call Interventional Radiology [phone] with any questions.     Conscious Sedation Sedation is the use of medicines to promote relaxation and relieve discomfort and anxiety. Conscious sedation is a type of sedation. Under conscious sedation you are less alert than normal but are still able to respond to instructions or stimulation. Conscious sedation is used during short medical and dental procedures. It is milder than deep sedation or general anesthesia and allows you to return to your regular activities sooner.  LET [facility] CARE PROVIDER KNOW ABOUT:   Any allergies you have.  All medicines you are taking, including vitamins, herbs, eye drops,  creams, and over-the-counter medicines.  Use of steroids (by mouth or creams).  Previous problems you or members of your family have had with the use of anesthetics.  Any blood disorders you have.  Previous surgeries you have had.  Medical conditions you have.  Possibility of pregnancy, if this applies.  Use of cigarettes, alcohol, or illegal drugs. RISKS AND COMPLICATIONS Generally, this is a safe procedure. However, as with any procedure, problems can occur. Possible problems include:  Oversedation.  Trouble breathing on your own. You may need to have a breathing tube until you are awake and breathing on your own.  Allergic reaction to any of the medicines used for the procedure. BEFORE THE PROCEDURE  You may have blood tests done. These tests can help show how well your kidneys and liver are working. They can also show how well your blood clots.  A physical exam will be done.  Only take medicines as directed by your health care provider. You may need to stop taking medicines (such as blood thinners, aspirin, or nonsteroidal anti-inflammatory drugs) before the procedure.   Do not eat or drink at least 6 hours before the procedure or as directed by your health care provider.  Arrange for a responsible adult, family member, or friend to take you home after the procedure. He or she should stay with you for at least 24 hours after the procedure, until the medicine has worn off. PROCEDURE   An intravenous (IV) catheter  will be inserted into one of your veins. Medicine will be able to flow directly into your body through this catheter. You may be given medicine through this tube to help prevent pain and help you relax.  The medical or dental procedure will be done. AFTER THE PROCEDURE  You will stay in a recovery area until the medicine has worn off. Your blood pressure and pulse will be checked.   Depending on the procedure you had, you may be allowed to go home when you  can tolerate liquids and your pain is under control. Document Released: 09/11/2001 Document Revised: 12/22/2013 Document Reviewed: 08/24/2013 [facility] Patient Information 2015 [place], [place]. This information is not intended to replace advice given to you by your health care provider. Make sure you discuss any questions you have with your health care provider.

## 2015-03-08 NOTE — Sedation Documentation (Addendum)
Pts heart rate dropped from high 50s SB to low 40s SB. Pt alert and oriented. Pt stated feeling "sick on his stomach".  Pts blood pressure dropped to 73/43. Normal saline was then run wide open. Orders were given to administer 1mg  of atropine. Pts HR immediately increased from 40s SB to 90s NSR. Blood pressures after atropine administration 81/58, 105/69, 106/69, 97/60, 97/63. Pt began experiencing some chest discomfort. 12-Lead EKG was ordered in addition to CKMB and troponin labs. 12-lead EKG showed NSR. Will continue to monitor patient for any significant changes.

## 2015-03-08 NOTE — Procedures (Signed)
Y90 R lobe liver embo  No comp

## 2015-03-08 NOTE — H&P (Signed)
Chief Complaint: "I'm having another treatment of my liver"  Referring Physician(s): Dr. Jacquiline Doe  History of Present Illness: [name] Fields is a 79 y.o. male  with colon cancer metastatic to the liver who underwent Y90 radio-embolization for bilateral liver disease, in December 2014. He is two years out and doing well. He is about 10 pounds heavier than his pretreatment weight and has an excellent appetite. He has been treated with systemic chemotherapy, but recently has started to develop neuropathy. His recent CEA was 16.6 in 12/14/2014. A CT at that time showed the the left lobe lesion was smaller and the right lobe lesion was stable in size. The accompanying PET showed no activity in the left lesion and some mild activity at the periphery of the right lobe lesion.  He has undergone pre-Y90 angiogram on 2/24 and is now scheduled today for Y-90 radioembolization of right hepatic lobe lesion.  Past Medical History  Diagnosis Date  . Colon cancer   . Arthritis     Past Surgical History  Procedure Laterality Date  . Colon surgery      jan 2013  . Power port Right 2013    Allergies: Review of patient's allergies indicates no known allergies.  Medications: Prior to Admission medications   Medication Sig Start Date End Date Taking? Authorizing Provider  diphenoxylate-atropine (LOMOTIL) 2.5-0.025 MG per tablet Take 2 tablets by mouth 4 (four) times daily as needed for diarrhea or loose stools (Do not take more than 8 tablets per day.).  03/03/14  Yes Historical Provider, MD  HYDROcodone-acetaminophen (NORCO/VICODIN) 5-325 MG per tablet Take 1 tablet by mouth every 6 (six) hours as needed (For pain.).  09/25/12  Yes Historical Provider, MD  naphazoline-pheniramine (NAPHCON-A) 0.025-0.3 % ophthalmic solution Place 2 drops into both eyes 4 (four) times daily as needed for irritation or allergies.    Yes Historical Provider, MD  omeprazole (PRILOSEC) 10 MG capsule Take 1 capsule (10 mg  total) by mouth daily. Patient taking differently: Take 10 mg by mouth daily as needed (For heartburn or acid reflux.).  12/17/12  Yes Art A Hoss, MD  omeprazole (PRILOSEC) 20 MG capsule Take 20 mg by mouth daily as needed (For heartburn or acid reflux.).  04/06/14  Yes Historical Provider, MD  prochlorperazine (COMPAZINE) 10 MG tablet Take 10 mg by mouth every 6 (six) hours as needed for nausea or vomiting.  03/12/14  Yes Historical Provider, MD  zolpidem (AMBIEN) 5 MG tablet Take 5 mg by mouth at bedtime.  11/17/14  Yes Historical Provider, MD  acetaminophen (TYLENOL) 500 MG tablet Take 1,000 mg by mouth every 6 (six) hours as needed (For pain.).     Historical Provider, MD    History reviewed. No pertinent family history.  History   Social History  . Marital Status: Widowed    Spouse Name: N/A  . Number of Children: N/A  . Years of Education: N/A   Social History Main Topics  . Smoking status: Current Every Day Smoker -- 1.00 packs/day for 50 years    Types: Cigarettes  . Smokeless tobacco: Not on file  . Alcohol Use: 3.5 oz/week    7 Standard drinks or equivalent per week     Comment: 2-3 times/week  . Drug Use: No  . Sexual Activity: Not on file   Other Topics Concern  . None   Social History Narrative      Review of Systems  Constitutional: Negative for fever and chills.  Respiratory: Negative  for shortness of breath.        Occ cough  Cardiovascular: Negative for chest pain.  Gastrointestinal: Negative for nausea, vomiting, abdominal pain and blood in stool.  Genitourinary: Negative for dysuria and hematuria.  Musculoskeletal: Negative for back pain.  Neurological: Negative for headaches.    Vital Signs: BP 102/64 mmHg  Pulse 64  Temp(Src) 97.5 F (36.4 C) (Oral)  Resp 18  Ht 5\' 11"  (1.803 m)  Wt 154 lb (69.854 kg)  BMI 21.49 kg/m2  SpO2 100%  Physical Exam  Constitutional: He is oriented to person, place, and time. He appears well-developed and  well-nourished.  Neck: Neck supple. No JVD present. No tracheal deviation present.  Cardiovascular: Normal rate and regular rhythm.   Clean, intact rt chest wall PAC   Pulmonary/Chest: Effort normal and breath sounds normal. No respiratory distress.  CTA bilat  Abdominal: Soft. Bowel sounds are normal. There is no tenderness.  Musculoskeletal: Normal range of motion. He exhibits no edema.  Neurological: He is alert and oriented to person, place, and time.  Psychiatric: His behavior is normal. Judgment normal.    Imaging: Nm Liver Img Spect  02/23/2015   CLINICAL DATA:  Colorectal carcinoma with unresectable liver metastasis. Pre Yttrium 90 radial embolization evaluation.  EXAM: NUCLEAR MEDICINE LIVER SCAN; liver SPECT., 3D fusion  TECHNIQUE: Abdominal images were obtained in multiple projections after intrahepatic arterial injection of radiopharmaceutical. SPECT imaging was performed. Lung shunt calculation was performed.  RADIOPHARMACEUTICALS:  5.0 mCi technetium 99 MAA  COMPARISON:  CT 11/15/2014  FINDINGS: The injected microaggregated albumin localizes within the right hepatic lobe. No evidence of activity within the stomach, duodenum, or bowel.  Calculated shunt fraction to the lungs equals 5.1%.  IMPRESSION: 1. No significant extrahepatic radiotracer activity following intrahepatic arterial injection of MAA. 2. Lung shunt fraction equals 5.1%   Electronically Signed   By: Suzy Bouchard M.D.   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  MEDICATIONS AND MEDICAL HISTORY: Versed 2 mg, Fentanyl 75 mcg.  Additional Medications: None.  ANESTHESIA/SEDATION: Moderate sedation time: 31 minutes   CONTRAST:  Thirty-nine cc Omnipaque 300  PROCEDURE: The procedure, risks, benefits, and alternatives were explained to the patient. Questions regarding the procedure were encouraged and answered. The patient understands and consents to the procedure.  The right groin was prepped with Betadine in a sterile fashion, and a sterile drape was applied covering the operative field. A sterile gown and sterile gloves were used for the procedure.  Under sonographic guidance, a micropuncture needle was inserted into the right common femoral artery and removed over a 018 wire which was up sized to a Bentson. A 5 French sheath was inserted.  A chunk 2.5 catheter was advanced over the Bentson to the descending thoracic aorta. And the reverse curve was formed. Celiac axis was selected. Angiography was performed.  A Progreat high flow catheter was advanced through the [place] can into the common hepatic artery. Angiography was performed.  The catheter was advanced beyond the gastroduodenal artery and left hepatic branches, and into the main right hepatic artery. Angiography was performed.  5 mCi technetium 8m MAA was then injected into the right hepatic artery.  Micro catheter and  shunt catheter were removed. The sheath was removed. Hemostasis was achieved with direct pressure.  FINDINGS: Angiography of the celiac axis and hepatic arterial tree delineates hepatic arterial anatomy. Branch vessel anatomy is expected. Gastroduodenal artery remains occluded without re- cannulization. Right gastric artery takeoff from the left hepatic artery is noted. No embolization was performed.  COMPLICATIONS: None  IMPRESSION: Successful pre Y 90 technetium 47m MAAinjection. Nuclear medicine imaging is to follow.   Electronically Signed   By: Marybelle Killings M.D.   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  MEDICATIONS AND MEDICAL HISTORY: Versed 2 mg, Fentanyl 75 mcg.  Additional Medications: None.  ANESTHESIA/SEDATION: Moderate sedation time: 31 minutes  CONTRAST:  Thirty-nine cc Omnipaque 300  PROCEDURE: The procedure, risks, benefits, and alternatives were explained to the patient. Questions regarding the procedure were encouraged and answered. The patient understands and consents to the procedure.  The right groin was prepped with Betadine in a sterile fashion, and a sterile drape was applied covering the operative field. A sterile gown and sterile gloves were used for the procedure.  Under sonographic guidance, a micropuncture needle was inserted into the right common femoral artery and removed over a 018 wire which was up sized to a Bentson. A 5 French sheath was inserted.  A chunk 2.5 catheter was advanced over the Bentson to the descending thoracic aorta. And the reverse curve was formed. Celiac axis was selected. Angiography was performed.  A Progreat high flow catheter was advanced through the [place] can into the common hepatic artery. Angiography was performed.  The catheter was advanced beyond the gastroduodenal artery and left hepatic branches, and into the main right hepatic artery. Angiography was performed.  5 mCi technetium 74m MAA was then injected into the right hepatic artery.  Micro catheter and shunt catheter were removed. The sheath was removed. Hemostasis was achieved with direct pressure.  FINDINGS: Angiography of the celiac axis and hepatic arterial tree delineates hepatic arterial anatomy. Branch vessel anatomy is expected. Gastroduodenal artery remains occluded without re- cannulization. Right gastric artery takeoff from the left hepatic artery is noted. No embolization was performed.  COMPLICATIONS: None   IMPRESSION: Successful pre Y 90 technetium 65m MAAinjection. Nuclear medicine imaging is to follow.   Electronically Signed   By: Marybelle Killings M.D.   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  MEDICATIONS AND MEDICAL HISTORY: Versed 2 mg, Fentanyl 75 mcg.  Additional Medications: None.  ANESTHESIA/SEDATION: Moderate sedation time: 31 minutes  CONTRAST:  Thirty-nine cc Omnipaque 300  PROCEDURE: The procedure, risks, benefits, and alternatives were explained to the patient. Questions regarding the procedure were encouraged and answered. The patient understands and consents to the procedure.  The right groin was prepped with Betadine in a sterile fashion, and a sterile drape was applied covering the operative field. A sterile gown and sterile gloves were used for the procedure.  Under sonographic guidance, a micropuncture needle was inserted into the right common femoral artery and removed over a 018 wire which was up sized to a Bentson. A 5 French sheath was inserted.  A chunk 2.5 catheter was advanced over the Bentson to the descending thoracic aorta. And the reverse curve was formed. Celiac axis was selected. Angiography was performed.  A Progreat high flow catheter was advanced through the [place] can into the common hepatic artery. Angiography was performed.  The catheter was advanced beyond the gastroduodenal artery and left hepatic branches, and into the main right hepatic artery. Angiography was performed.  5 mCi technetium 61m MAA was then injected into the right hepatic artery.  Micro catheter and shunt catheter were removed. The sheath was removed. Hemostasis was achieved with direct  pressure.  FINDINGS: Angiography of the celiac axis and hepatic arterial tree delineates hepatic arterial anatomy. Branch vessel anatomy is expected. Gastroduodenal artery remains occluded without re- cannulization. Right gastric artery takeoff from the left hepatic artery is noted. No embolization was performed.  COMPLICATIONS: None  IMPRESSION: Successful pre Y 90 technetium 35m MAAinjection. Nuclear medicine imaging is to follow.   Electronically Signed   By: Marybelle Killings M.D.   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  MEDICATIONS AND MEDICAL HISTORY: Versed 2 mg, Fentanyl 75 mcg.  Additional Medications: None.  ANESTHESIA/SEDATION: Moderate sedation time: 31 minutes  CONTRAST:  Thirty-nine cc Omnipaque 300  PROCEDURE: The procedure, risks, benefits, and alternatives were explained to the patient. Questions regarding the procedure were encouraged and answered. The patient understands and consents to the procedure.  The right groin was prepped with Betadine in a sterile fashion, and a sterile drape was applied covering the operative field. A sterile gown and sterile gloves were used for the procedure.  Under sonographic guidance, a micropuncture needle was inserted into the right common femoral artery and removed over a 018 wire which was up sized to a Bentson. A 5 French sheath was inserted.  A chunk 2.5 catheter was advanced over the Bentson to the descending thoracic aorta. And the reverse curve was formed. Celiac axis was selected. Angiography was performed.  A Progreat high flow catheter was advanced through the [place] can into the common hepatic artery. Angiography was performed.  The catheter was  advanced beyond the gastroduodenal artery and left hepatic branches, and into the main right hepatic artery. Angiography was performed.  5 mCi technetium 61m MAA was then injected into the right hepatic artery.  Micro catheter and shunt catheter were removed. The sheath was removed. Hemostasis was achieved with direct pressure.  FINDINGS: Angiography of the celiac axis and hepatic arterial tree delineates hepatic arterial anatomy. Branch vessel anatomy is expected. Gastroduodenal artery remains occluded without re- cannulization. Right gastric artery takeoff from the left hepatic artery is noted. No embolization was performed.  COMPLICATIONS: None  IMPRESSION: Successful pre Y 90 technetium 18m MAAinjection. Nuclear medicine imaging is to follow.   Electronically Signed   By: Marybelle Killings M.D.   On: 02/23/2015 12:04   Ir Embo Arterial Not [place]  02/23/2015   CLINICAL DATA:  Coal rectal carcinoma within the right lobe of  the liver  EXAM: IR EMBO ARTERIAL NOT HEMORR HEMANG INC GUIDE ROADMAPPING; ADDITIONAL ARTERIOGRAPHY; IR ULTRASOUND GUIDANCE VASC ACCESS RIGHT; SELECTIVE VISCERAL ARTERIOGRAPHY  FLUOROSCOPY TIME:  6 minutes and 54 seconds.  MEDICATIONS AND MEDICAL HISTORY: Versed 2 mg, Fentanyl 75 mcg.  Additional Medications: None.  ANESTHESIA/SEDATION: Moderate sedation time: 31 minutes  CONTRAST:  Thirty-nine cc Omnipaque 300  PROCEDURE: The procedure, risks, benefits, and alternatives were explained to the patient. Questions regarding the procedure were encouraged and answered. The patient understands and consents to the procedure.  The right groin was prepped with Betadine in a sterile fashion, and a sterile drape was applied covering the operative field. A sterile gown and sterile gloves were used for the procedure.  Under sonographic guidance, a micropuncture needle was inserted into the right common femoral artery and removed over a 018 wire which was up sized to a Bentson. A 5  French sheath was inserted.  A chunk 2.5 catheter was advanced over the Bentson to the descending thoracic aorta. And the reverse curve was formed. Celiac axis was selected. Angiography was performed.  A Progreat high flow catheter was advanced through the [place] can into the common hepatic artery. Angiography was performed.  The catheter was advanced beyond the gastroduodenal artery and left hepatic branches, and into the main right hepatic artery. Angiography was performed.  5 mCi technetium 106m MAA was then injected into the right hepatic artery.  Micro catheter and shunt catheter were removed. The sheath was removed. Hemostasis was achieved with direct pressure.  FINDINGS: Angiography of the celiac axis and hepatic arterial tree delineates hepatic arterial anatomy. Branch vessel anatomy is expected. Gastroduodenal artery remains occluded without re- cannulization. Right gastric artery takeoff from the left hepatic artery is noted. No embolization was performed.  COMPLICATIONS: None  IMPRESSION: Successful pre Y 90 technetium 69m MAAinjection. Nuclear medicine imaging is to follow.   Electronically Signed   By: Marybelle Killings M.D.   On: 02/23/2015 12:04   Nm Fusion  02/23/2015   CLINICAL DATA:  Colorectal carcinoma with unresectable liver metastasis. Pre Yttrium 90 radial embolization evaluation.  EXAM: NUCLEAR MEDICINE LIVER SCAN; liver SPECT., 3D fusion  TECHNIQUE: Abdominal images were obtained in multiple projections after intrahepatic arterial injection of radiopharmaceutical. SPECT imaging was performed. Lung shunt calculation was performed.  RADIOPHARMACEUTICALS:  5.0 mCi technetium 99 MAA  COMPARISON:  CT 11/15/2014  FINDINGS: The injected microaggregated albumin localizes within the right hepatic lobe. No evidence of activity within the stomach, duodenum, or bowel.  Calculated shunt fraction to the lungs equals 5.1%.  IMPRESSION: 1. No significant extrahepatic radiotracer activity following intrahepatic  arterial injection of MAA. 2. Lung shunt fraction equals 5.1%   Electronically Signed   By: Suzy Bouchard M.D.   On: 02/23/2015 16:15    Labs:  CBC:  Recent Labs  02/23/15 0810 03/02/15 1427 03/08/15 0745  WBC 4.4 2.8* 5.3  HGB 11.5* 13.6 12.1*  HCT 35.4* 41.4 36.9*  PLT 131* 166 142*    COAGS:  Recent Labs  02/23/15 0810 03/08/15 0745  INR 1.03 1.05  APTT 33 32    BMP:  Recent Labs  02/23/15 0810 03/02/15 1427  NA 139 142  K 4.1 4.3  CL 107 104  CO2 27 28  GLUCOSE 116* 108*  BUN 12 13  CALCIUM 8.8 9.1  CREATININE 0.77 1.29  GFRNONAA 86*  --   GFRAA >90  --     LIVER FUNCTION TESTS:  Recent Labs  02/23/15 0810  03/02/15 1427  BILITOT 0.4 0.3  AST 22 18  ALT 17 13  ALKPHOS 176* 104  PROT 7.3 6.5  ALBUMIN 3.9 4.0    TUMOR MARKERS: No results for input(s): AFPTM, CEA, CA199, CHROMGRNA in the last 8760 hours.  Assessment and Plan: Metastatic colon cancer with right hepatic lobe lesion. Plan for Y-90 embo today Labs reviewed, ok Risks and Benefits discussed with the patient including, but not limited to bleeding, infection, vascular injury, post procedural pain, nausea, vomiting and fatigue, contrast induced renal failure, liver failure, radiation injury to the bowel, radiation induced cholecystitis, neutropenia and possible need for additional procedures. All of the patient's questions were answered, patient is agreeable to proceed. Consent signed and in chart.     SignedAscencion Dike 03/08/2015, 8:47 AM   I spent a total of 15 minutes face to face in clinical consultation, greater than 50% of which was counseling/coordinating care for Y-90 radioembolization of right hepatic lobe metastasis

## 2015-03-09 ENCOUNTER — Telehealth: Payer: Self-pay | Admitting: Radiology

## 2015-03-09 ENCOUNTER — Other Ambulatory Visit (HOSPITAL_COMMUNITY): Payer: Self-pay | Admitting: Interventional Radiology

## 2015-03-09 DIAGNOSIS — C189 Malignant neoplasm of colon, unspecified: Secondary | ICD-10-CM

## 2015-03-09 DIAGNOSIS — C787 Secondary malignant neoplasm of liver and intrahepatic bile duct: Principal | ICD-10-CM

## 2015-03-09 MED ORDER — FAMOTIDINE 20 MG PO TABS: 20.0000 mg | ORAL_TABLET | Freq: Two times a day (BID) | ORAL | Status: DC

## 2015-03-09 MED ORDER — PROMETHAZINE HCL 50 MG PO TABS: 25.0000 mg | ORAL_TABLET | Freq: Four times a day (QID) | ORAL | Status: DC | PRN

## 2015-03-09 NOTE — Telephone Encounter (Signed)
Per Dr Barbie Banner:  Rx phoned to [facility], [place]:  For complaints of nausea, vomiting and abdominal discomfort 1 day post Y-90 SIRT.  Patient has follow up app't w/ Dr Maryclare Bean on March 30, 2015 at 11:00 am.  Reece Levy, RN 03/09/2015 11:46 AM

## 2015-03-09 NOTE — Progress Notes (Unsigned)
Phoned patient for 1 day follow up S/P Y-90 SIRT.  Patient has complaints of abdominal discomfort, nausea & vomiting.  Afebrile.  Dr Art Barbie Banner spoke with patient to confirm post procedure instructions.    Rx order phoned to [facility], [address] [place], [place], [place] 03/09/2015 11:39 AM

## 2015-03-09 NOTE — Telephone Encounter (Deleted)
error 

## 2015-03-30 ENCOUNTER — Ambulatory Visit
Admission: RE | Admit: 2015-03-30 | Discharge: 2015-03-30 | Disposition: A | Discharge status: Home / Self Care | Payer: Medicare HMO | Source: Ambulatory Visit | Attending: Interventional Radiology | Admitting: Interventional Radiology

## 2015-03-30 DIAGNOSIS — C189 Malignant neoplasm of colon, unspecified: Secondary | ICD-10-CM

## 2015-03-30 DIAGNOSIS — C787 Secondary malignant neoplasm of liver and intrahepatic bile duct: Principal | ICD-10-CM

## 2015-03-30 HISTORY — PX: IR GENERIC HISTORICAL: IMG1180011

## 2015-03-30 NOTE — Progress Notes (Signed)
Patient ID: [name] Fields, male   DOB: 1936/10/30, 79 y.o.   MRN: [id number]    Chief Complaint: Chief Complaint  Patient presents with  . Follow-up    4 wk follow up Y-90 [facility]    Referring Physician(s): Daxen Lanum  History of Present Illness: [name] Fields is a 79 y.o. male status post Y90 the second treatment of a lesion in the right lobe of the liver. He had epigastric pain for a couple of days after the procedure but this subsided after administration with antacids. He did well but recently developed small bowel enteritis and bowel obstruction. He was treated conservatively in the hospital and was discharged. He is currently doing well and eating. He denies any fevers or chills.  Past Medical History  Diagnosis Date  . Colon cancer   . Arthritis     Past Surgical History  Procedure Laterality Date  . Colon surgery      jan 2013  . Power port Right 2013    Allergies: Review of patient's allergies indicates no known allergies.  Medications: Prior to Admission medications   Medication Sig Start Date End Date Taking? Authorizing Provider  acetaminophen (TYLENOL) 500 MG tablet Take 1,000 mg by mouth every 6 (six) hours as needed (For pain.).     Historical Provider, MD  diphenoxylate-atropine (LOMOTIL) 2.5-0.025 MG per tablet Take 2 tablets by mouth 4 (four) times daily as needed for diarrhea or loose stools (Do not take more than 8 tablets per day.).  03/03/14   Historical Provider, MD  famotidine (PEPCID) 20 MG tablet Take 1 tablet (20 mg total) by mouth 2 (two) times daily. 03/09/15 03/08/16  Marybelle Killings, MD  HYDROcodone-acetaminophen (NORCO/VICODIN) 5-325 MG per tablet Take 1 tablet by mouth every 6 (six) hours as needed (For pain.).  09/25/12   Historical Provider, MD  naphazoline-pheniramine (NAPHCON-A) 0.025-0.3 % ophthalmic solution Place 2 drops into both eyes 4 (four) times daily as needed for irritation or allergies.     Historical Provider, MD  omeprazole (PRILOSEC) 10 MG  capsule Take 1 capsule (10 mg total) by mouth daily. Patient not taking: Reported on 03/04/2015 12/17/12   Marybelle Killings, MD  omeprazole (PRILOSEC) 20 MG capsule Take 20 mg by mouth daily as needed (For heartburn or acid reflux.).  04/06/14   Historical Provider, MD  prochlorperazine (COMPAZINE) 10 MG tablet Take 10 mg by mouth every 6 (six) hours as needed for nausea or vomiting.  03/12/14   Historical Provider, MD  promethazine (PHENERGAN) 50 MG tablet Take 0.5 tablets (25 mg total) by mouth every 6 (six) hours as needed for nausea or vomiting. 03/09/15   Marybelle Killings, MD  zolpidem (AMBIEN) 5 MG tablet Take 5 mg by mouth at bedtime.  11/17/14   Historical Provider, MD     No family history on file.  History   Social History  . Marital Status: Widowed    Spouse Name: N/A  . Number of Children: N/A  . Years of Education: N/A   Social History Main Topics  . Smoking status: Current Every Day Smoker -- 1.00 packs/day for 50 years    Types: Cigarettes  . Smokeless tobacco: Not on file  . Alcohol Use: 3.5 oz/week    7 Standard drinks or equivalent per week     Comment: 2-3 times/week  . Drug Use: No  . Sexual Activity: Not on file   Other Topics Concern  . Not on file   Social History Narrative  Review of Systems: A 12 point ROS discussed and pertinent positives are indicated in the HPI above.  All other systems are negative.  Review of Systems  Vital Signs: BP 104/63 mmHg  Pulse 75  Temp(Src) 98 F (36.7 C) (Oral)  Resp 15  Ht 5\' 11"  (1.803 m)  Wt 144 lb (65.318 kg)  BMI 20.09 kg/m2  SpO2 98%  Physical Exam  Constitutional: He is oriented to person, place, and time. He appears well-developed and well-nourished.  Cardiovascular: Normal rate and regular rhythm.   Pulmonary/Chest: Effort normal.  Musculoskeletal:  Right groin is clean and dry without evidence of pulsatile mass. Right dorsalis pedis and posterior tibial pulses are 2+.  Neurological: He is alert and oriented  to person, place, and time.    Mallampati Score:     Imaging: Nm Liver Img Spect  03/08/2015   CLINICAL DATA:  Colorectal carcinoma with liver metastasis. Patient received primary on station of the right and left hepatic lobes and October and December 2013 respectively. Patient presents for re-treatment of the right hepatic lobe.  EXAM: NUCLEAR MEDICINE SPECIAL MED RAD PHYSICS CONS; NUCLEAR MEDICINE RADIO PHARM THERAPY INTRA ARTERIAL; NUCLEAR MEDICINE TREATMENT PROCEDURE; NUCLEAR MEDICINE LIVER SCAN  TECHNIQUE: In conjunction with the interventional radiologist a Y- Microsphere dose was calculated utilizing body surface area formulation. Calculated dose equal 28 .4 mCi. Pre therapy MAA liver SPECT scan and CTA were evaluated. Utilizing a microcatheter system, the hepatic artery was selected and Y-90 microspheres were delivered in fractionated aliquots. Radiopharmaceutical was delivered by the interventional radiologist and nuclear radiologist.  Patient had episode of bradycardia and hypertension. The entire dose was not delivered due to concern for patient stability.  The patient tolerated procedure well. No adverse effects were noted.  Bremsstrahlung planar and SPECT imaging of the abdomen following intrahepatic arterial delivery of Y-90 microsphere was performed.  RADIOPHARMACEUTICALS:  23.7 mCi Yttrium 90 microspheres  COMPARISON:  MAA scan 02/23/2015.  PET-CT scan 12/20/2014.  FINDINGS: Y - 90 microspheres therapy as above. Second therapy the right hepatic lobe.  Bremsstrahlung planar and SPECT imaging of the abdomen following intrahepatic arterial delivery of Y-31microsphere demonstrates radioactivity localized to the right hepatic lobe. No evidence of extrahepatic activity.  IMPRESSION: Successful Y - 90 microsphere delivery for treatment of unresectable liver metastasis. Second therapy to the right lobe.  Bremssstrahlung scan demonstrates activity localized to right hepatic lobe with no extrahepatic  activity identified.   Electronically Signed   By: Suzy Bouchard M.D.   On: 03/08/2015 15:19   Ir Angiogram Visceral Selective  03/08/2015   CLINICAL DATA:  Metastatic colorectal cancer. Y 90 treatment. Right lobe lesion.  EXAM: IR EMBO TUMOR ORGAN ISCHEMIA INFARCT INC GUIDE ROADMAPPING; ADDITIONAL ARTERIOGRAPHY; IR ULTRASOUND GUIDANCE VASC ACCESS RIGHT; SELECTIVE VISCERAL ARTERIOGRAPHY  FLUOROSCOPY TIME:  9 minutes 36 seconds.  MEDICATIONS AND MEDICAL HISTORY: Versed 1 mg, Fentanyl 50 mcg.  As antibiotic prophylaxis, Ancef was ordered pre-procedure and administered intravenously within one hour of incision.  ANESTHESIA/SEDATION: Moderate sedation time: 52 minutes  CONTRAST:  100 cc Omnipaque 300  PROCEDURE: The procedure, risks, benefits, and alternatives were explained to the patient. Questions regarding the procedure were encouraged and answered. The patient understands and consents to the procedure.  The right groin was prepped with Betadine in a sterile fashion, and a sterile drape was applied covering the operative field. A sterile gown and sterile gloves were used for the procedure.  Under sonographic guidance, a micropuncture needle was inserted into the right common  femoral artery and removed over a 018 wire which was up sized to a Bentson. A 5 French sheath was inserted.  A Chung 2.5 catheter was advanced into the descending thoracic aorta. The curve was created without difficulty. The celiac was selected. Angiography was performed.  A micro catheter was advanced through the [facility] catheter and into the right hepatic artery. Angiography was performed. The catheter was advanced to the distal right hepatic artery and a final angiogram was performed  Y 90 embolization into the right lobe of the liver are was performed per protocol. 29 mCi was administered.  Post embolization imaging was performed with contrast injection into the catheter  The catheter was flushed. The micro catheter and shunt catheter  were removed without complication and placed into the radiation safety receptacle.  Femoral angiogram was performed for Exoseal. Exo seal was deployed without complication and hemostasis was achieved.  FINDINGS: Hepatic angiography delineates arterial anatomy and patency of the right hepatic artery. The gastroduodenal artery remains occluded.  Post embolization angiogram of the right hepatic artery confirms patency.  COMPLICATIONS: None  IMPRESSION: Successful Y 90 radiation embolic therapy for metastatic colorectal disease in the right lobe of the liver.   Electronically Signed   By: Marybelle Killings M.D.   On: 03/08/2015 13:18   Ir Angiogram Selective Each Additional Vessel  03/08/2015   CLINICAL DATA:  Metastatic colorectal cancer. Y 90 treatment. Right lobe lesion.  EXAM: IR EMBO TUMOR ORGAN ISCHEMIA INFARCT INC GUIDE ROADMAPPING; ADDITIONAL ARTERIOGRAPHY; IR ULTRASOUND GUIDANCE VASC ACCESS RIGHT; SELECTIVE VISCERAL ARTERIOGRAPHY  FLUOROSCOPY TIME:  9 minutes 36 seconds.  MEDICATIONS AND MEDICAL HISTORY: Versed 1 mg, Fentanyl 50 mcg.  As antibiotic prophylaxis, Ancef was ordered pre-procedure and administered intravenously within one hour of incision.  ANESTHESIA/SEDATION: Moderate sedation time: 52 minutes  CONTRAST:  100 cc Omnipaque 300  PROCEDURE: The procedure, risks, benefits, and alternatives were explained to the patient. Questions regarding the procedure were encouraged and answered. The patient understands and consents to the procedure.  The right groin was prepped with Betadine in a sterile fashion, and a sterile drape was applied covering the operative field. A sterile gown and sterile gloves were used for the procedure.  Under sonographic guidance, a micropuncture needle was inserted into the right common femoral artery and removed over a 018 wire which was up sized to a Bentson. A 5 French sheath was inserted.  A Chung 2.5 catheter was advanced into the descending thoracic aorta. The curve was  created without difficulty. The celiac was selected. Angiography was performed.  A micro catheter was advanced through the [facility] catheter and into the right hepatic artery. Angiography was performed. The catheter was advanced to the distal right hepatic artery and a final angiogram was performed  Y 90 embolization into the right lobe of the liver are was performed per protocol. 29 mCi was administered.  Post embolization imaging was performed with contrast injection into the catheter  The catheter was flushed. The micro catheter and shunt catheter were removed without complication and placed into the radiation safety receptacle.  Femoral angiogram was performed for Exoseal. Exo seal was deployed without complication and hemostasis was achieved.  FINDINGS: Hepatic angiography delineates arterial anatomy and patency of the right hepatic artery. The gastroduodenal artery remains occluded.  Post embolization angiogram of the right hepatic artery confirms patency.  COMPLICATIONS: None  IMPRESSION: Successful Y 90 radiation embolic therapy for metastatic colorectal disease in the right lobe of the liver.   Electronically Signed  By: Marybelle Killings M.D.   On: 03/08/2015 13:18   Nm Special Med Rad Physics Cons  03/08/2015   CLINICAL DATA:  Colorectal carcinoma with liver metastasis. Patient received primary on station of the right and left hepatic lobes and October and December 2013 respectively. Patient presents for re-treatment of the right hepatic lobe.  EXAM: NUCLEAR MEDICINE SPECIAL MED RAD PHYSICS CONS; NUCLEAR MEDICINE RADIO PHARM THERAPY INTRA ARTERIAL; NUCLEAR MEDICINE TREATMENT PROCEDURE; NUCLEAR MEDICINE LIVER SCAN  TECHNIQUE: In conjunction with the interventional radiologist a Y- Microsphere dose was calculated utilizing body surface area formulation. Calculated dose equal 28 .4 mCi. Pre therapy MAA liver SPECT scan and CTA were evaluated. Utilizing a microcatheter system, the hepatic artery was selected and  Y-90 microspheres were delivered in fractionated aliquots. Radiopharmaceutical was delivered by the interventional radiologist and nuclear radiologist.  Patient had episode of bradycardia and hypertension. The entire dose was not delivered due to concern for patient stability.  The patient tolerated procedure well. No adverse effects were noted.  Bremsstrahlung planar and SPECT imaging of the abdomen following intrahepatic arterial delivery of Y-90 microsphere was performed.  RADIOPHARMACEUTICALS:  23.7 mCi Yttrium 90 microspheres  COMPARISON:  MAA scan 02/23/2015.  PET-CT scan 12/20/2014.  FINDINGS: Y - 90 microspheres therapy as above. Second therapy the right hepatic lobe.  Bremsstrahlung planar and SPECT imaging of the abdomen following intrahepatic arterial delivery of Y-81microsphere demonstrates radioactivity localized to the right hepatic lobe. No evidence of extrahepatic activity.  IMPRESSION: Successful Y - 90 microsphere delivery for treatment of unresectable liver metastasis. Second therapy to the right lobe.  Bremssstrahlung scan demonstrates activity localized to right hepatic lobe with no extrahepatic activity identified.   Electronically Signed   By: Suzy Bouchard M.D.   On: 03/08/2015 15:19   Nm Special Treatment Procedure  03/08/2015   CLINICAL DATA:  Colorectal carcinoma with liver metastasis. Patient received primary on station of the right and left hepatic lobes and October and December 2013 respectively. Patient presents for re-treatment of the right hepatic lobe.  EXAM: NUCLEAR MEDICINE SPECIAL MED RAD PHYSICS CONS; NUCLEAR MEDICINE RADIO PHARM THERAPY INTRA ARTERIAL; NUCLEAR MEDICINE TREATMENT PROCEDURE; NUCLEAR MEDICINE LIVER SCAN  TECHNIQUE: In conjunction with the interventional radiologist a Y- Microsphere dose was calculated utilizing body surface area formulation. Calculated dose equal 28 .4 mCi. Pre therapy MAA liver SPECT scan and CTA were evaluated. Utilizing a microcatheter  system, the hepatic artery was selected and Y-90 microspheres were delivered in fractionated aliquots. Radiopharmaceutical was delivered by the interventional radiologist and nuclear radiologist.  Patient had episode of bradycardia and hypertension. The entire dose was not delivered due to concern for patient stability.  The patient tolerated procedure well. No adverse effects were noted.  Bremsstrahlung planar and SPECT imaging of the abdomen following intrahepatic arterial delivery of Y-90 microsphere was performed.  RADIOPHARMACEUTICALS:  23.7 mCi Yttrium 90 microspheres  COMPARISON:  MAA scan 02/23/2015.  PET-CT scan 12/20/2014.  FINDINGS: Y - 90 microspheres therapy as above. Second therapy the right hepatic lobe.  Bremsstrahlung planar and SPECT imaging of the abdomen following intrahepatic arterial delivery of Y-22microsphere demonstrates radioactivity localized to the right hepatic lobe. No evidence of extrahepatic activity.  IMPRESSION: Successful Y - 90 microsphere delivery for treatment of unresectable liver metastasis. Second therapy to the right lobe.  Bremssstrahlung scan demonstrates activity localized to right hepatic lobe with no extrahepatic activity identified.   Electronically Signed   By: Suzy Bouchard M.D.   On: 03/08/2015 15:19  Ir US Guide Vasc Access Right  03/08/2015   CLINICAL DATA:  Metastatic colorectal cancer. Y 90 treatment. Right lobe lesion.  EXAM: IR EMBO TUMOR ORGAN ISCHEMIA INFARCT INC GUIDE ROADMAPPING; ADDITIONAL ARTERIOGRAPHY; IR ULTRASOUND GUIDANCE VASC ACCESS RIGHT; SELECTIVE VISCERAL ARTERIOGRAPHY  FLUOROSCOPY TIME:  9 minutes 36 seconds.  MEDICATIONS AND MEDICAL HISTORY: Versed 1 mg, Fentanyl 50 mcg.  As antibiotic prophylaxis, Ancef was ordered pre-procedure and administered intravenously within one hour of incision.  ANESTHESIA/SEDATION: Moderate sedation time: 52 minutes  CONTRAST:  100 cc Omnipaque 300  PROCEDURE: The procedure, risks, benefits, and alternatives  were explained to the patient. Questions regarding the procedure were encouraged and answered. The patient understands and consents to the procedure.  The right groin was prepped with Betadine in a sterile fashion, and a sterile drape was applied covering the operative field. A sterile gown and sterile gloves were used for the procedure.  Under sonographic guidance, a micropuncture needle was inserted into the right common femoral artery and removed over a 018 wire which was up sized to a Bentson. A 5 French sheath was inserted.  A Chung 2.5 catheter was advanced into the descending thoracic aorta. The curve was created without difficulty. The celiac was selected. Angiography was performed.  A micro catheter was advanced through the [facility] catheter and into the right hepatic artery. Angiography was performed. The catheter was advanced to the distal right hepatic artery and a final angiogram was performed  Y 90 embolization into the right lobe of the liver are was performed per protocol. 29 mCi was administered.  Post embolization imaging was performed with contrast injection into the catheter  The catheter was flushed. The micro catheter and shunt catheter were removed without complication and placed into the radiation safety receptacle.  Femoral angiogram was performed for Exoseal. Exo seal was deployed without complication and hemostasis was achieved.  FINDINGS: Hepatic angiography delineates arterial anatomy and patency of the right hepatic artery. The gastroduodenal artery remains occluded.  Post embolization angiogram of the right hepatic artery confirms patency.  COMPLICATIONS: None  IMPRESSION: Successful Y 90 radiation embolic therapy for metastatic colorectal disease in the right lobe of the liver.   Electronically Signed   By: Marybelle Killings M.D.   On: 03/08/2015 13:18   Ir Embo Tumor Organ Ischemia Infarct Inc Guide Roadmapping  03/08/2015   CLINICAL DATA:  Metastatic colorectal cancer. Y 90 treatment.  Right lobe lesion.  EXAM: IR EMBO TUMOR ORGAN ISCHEMIA INFARCT INC GUIDE ROADMAPPING; ADDITIONAL ARTERIOGRAPHY; IR ULTRASOUND GUIDANCE VASC ACCESS RIGHT; SELECTIVE VISCERAL ARTERIOGRAPHY  FLUOROSCOPY TIME:  9 minutes 36 seconds.  MEDICATIONS AND MEDICAL HISTORY: Versed 1 mg, Fentanyl 50 mcg.  As antibiotic prophylaxis, Ancef was ordered pre-procedure and administered intravenously within one hour of incision.  ANESTHESIA/SEDATION: Moderate sedation time: 52 minutes  CONTRAST:  100 cc Omnipaque 300  PROCEDURE: The procedure, risks, benefits, and alternatives were explained to the patient. Questions regarding the procedure were encouraged and answered. The patient understands and consents to the procedure.  The right groin was prepped with Betadine in a sterile fashion, and a sterile drape was applied covering the operative field. A sterile gown and sterile gloves were used for the procedure.  Under sonographic guidance, a micropuncture needle was inserted into the right common femoral artery and removed over a 018 wire which was up sized to a Bentson. A 5 French sheath was inserted.  A Chung 2.5 catheter was advanced into the descending thoracic aorta. The curve was created  without difficulty. The celiac was selected. Angiography was performed.  A micro catheter was advanced through the [facility] catheter and into the right hepatic artery. Angiography was performed. The catheter was advanced to the distal right hepatic artery and a final angiogram was performed  Y 90 embolization into the right lobe of the liver are was performed per protocol. 29 mCi was administered.  Post embolization imaging was performed with contrast injection into the catheter  The catheter was flushed. The micro catheter and shunt catheter were removed without complication and placed into the radiation safety receptacle.  Femoral angiogram was performed for Exoseal. Exo seal was deployed without complication and hemostasis was achieved.  FINDINGS:  Hepatic angiography delineates arterial anatomy and patency of the right hepatic artery. The gastroduodenal artery remains occluded.  Post embolization angiogram of the right hepatic artery confirms patency.  COMPLICATIONS: None  IMPRESSION: Successful Y 90 radiation embolic therapy for metastatic colorectal disease in the right lobe of the liver.   Electronically Signed   By: Marybelle Killings M.D.   On: 03/08/2015 13:18   Nm Radio Pharm Therapy Intraarterial  03/08/2015   CLINICAL DATA:  Colorectal carcinoma with liver metastasis. Patient received primary on station of the right and left hepatic lobes and October and December 2013 respectively. Patient presents for re-treatment of the right hepatic lobe.  EXAM: NUCLEAR MEDICINE SPECIAL MED RAD PHYSICS CONS; NUCLEAR MEDICINE RADIO PHARM THERAPY INTRA ARTERIAL; NUCLEAR MEDICINE TREATMENT PROCEDURE; NUCLEAR MEDICINE LIVER SCAN  TECHNIQUE: In conjunction with the interventional radiologist a Y- Microsphere dose was calculated utilizing body surface area formulation. Calculated dose equal 28 .4 mCi. Pre therapy MAA liver SPECT scan and CTA were evaluated. Utilizing a microcatheter system, the hepatic artery was selected and Y-90 microspheres were delivered in fractionated aliquots. Radiopharmaceutical was delivered by the interventional radiologist and nuclear radiologist.  Patient had episode of bradycardia and hypertension. The entire dose was not delivered due to concern for patient stability.  The patient tolerated procedure well. No adverse effects were noted.  Bremsstrahlung planar and SPECT imaging of the abdomen following intrahepatic arterial delivery of Y-90 microsphere was performed.  RADIOPHARMACEUTICALS:  23.7 mCi Yttrium 90 microspheres  COMPARISON:  MAA scan 02/23/2015.  PET-CT scan 12/20/2014.  FINDINGS: Y - 90 microspheres therapy as above. Second therapy the right hepatic lobe.  Bremsstrahlung planar and SPECT imaging of the abdomen following  intrahepatic arterial delivery of Y-14microsphere demonstrates radioactivity localized to the right hepatic lobe. No evidence of extrahepatic activity.  IMPRESSION: Successful Y - 90 microsphere delivery for treatment of unresectable liver metastasis. Second therapy to the right lobe.  Bremssstrahlung scan demonstrates activity localized to right hepatic lobe with no extrahepatic activity identified.   Electronically Signed   By: Suzy Bouchard M.D.   On: 03/08/2015 15:19    Labs:  CBC:  Recent Labs  02/23/15 0810 03/02/15 1427 03/08/15 0745  WBC 4.4 2.8* 5.3  HGB 11.5* 13.6 12.1*  HCT 35.4* 41.4 36.9*  PLT 131* 166 142*    COAGS:  Recent Labs  02/23/15 0810 03/08/15 0745  INR 1.03 1.05  APTT 33 32    BMP:  Recent Labs  02/23/15 0810 03/02/15 1427 03/08/15 0745  NA 139 142 135  K 4.1 4.3 4.5  CL 107 104 107  CO2 27 28 27   GLUCOSE 116* 108* 122*  BUN 12 13 11   CALCIUM 8.8 9.1 9.0  CREATININE 0.77 1.29 0.82  GFRNONAA 86*  --  84*  GFRAA >90  --  >  90    LIVER FUNCTION TESTS:  Recent Labs  02/23/15 0810 03/02/15 1427 03/08/15 0745  BILITOT 0.4 0.3 0.7  AST 22 18 22   ALT 17 13 16   ALKPHOS 176* 104 175*  PROT 7.3 6.5 7.6  ALBUMIN 3.9 4.0 4.0    TUMOR MARKERS: No results for input(s): AFPTM, CEA, CA199, CHROMGRNA in the last 8760 hours.  Assessment and Plan:  Mr. Swatek is currently doing quite well after his second Y 90 treatment in the right lobe of his liver. He will undergo a CEA level and follow-up imaging with Dr. Abran Duke, preferably at 3 months. We will contact him and set up follow-up appointment after his imaging.    Signed: Bricia Taher, ART A 03/30/2015, 11:46 AM   I spent a total of   10 Minutes in face to face in clinical consultation, greater than 50% of which was counseling/coordinating care for colon cancer.

## 2015-10-13 DIAGNOSIS — C787 Secondary malignant neoplasm of liver and intrahepatic bile duct: Secondary | ICD-10-CM | POA: Diagnosis not present

## 2015-10-13 DIAGNOSIS — C189 Malignant neoplasm of colon, unspecified: Secondary | ICD-10-CM | POA: Diagnosis not present

## 2015-10-19 ENCOUNTER — Other Ambulatory Visit (HOSPITAL_COMMUNITY): Payer: Self-pay | Admitting: Oncology

## 2015-10-19 DIAGNOSIS — C189 Malignant neoplasm of colon, unspecified: Secondary | ICD-10-CM | POA: Diagnosis not present

## 2015-10-19 DIAGNOSIS — Z95828 Presence of other vascular implants and grafts: Secondary | ICD-10-CM | POA: Diagnosis not present

## 2015-10-19 DIAGNOSIS — J439 Emphysema, unspecified: Secondary | ICD-10-CM | POA: Diagnosis not present

## 2015-10-19 DIAGNOSIS — Z8719 Personal history of other diseases of the digestive system: Secondary | ICD-10-CM | POA: Diagnosis not present

## 2015-10-19 DIAGNOSIS — C787 Secondary malignant neoplasm of liver and intrahepatic bile duct: Principal | ICD-10-CM

## 2015-10-20 DIAGNOSIS — Z23 Encounter for immunization: Secondary | ICD-10-CM | POA: Diagnosis not present

## 2015-10-31 ENCOUNTER — Ambulatory Visit (HOSPITAL_COMMUNITY): Payer: Medicare HMO

## 2015-11-04 ENCOUNTER — Ambulatory Visit (HOSPITAL_COMMUNITY)
Admission: RE | Admit: 2015-11-04 | Discharge: 2015-11-04 | Disposition: A | Discharge status: Home / Self Care | Payer: Medicare HMO | Source: Ambulatory Visit | Attending: Oncology | Admitting: Oncology

## 2015-11-04 DIAGNOSIS — J439 Emphysema, unspecified: Secondary | ICD-10-CM | POA: Diagnosis not present

## 2015-11-04 DIAGNOSIS — C787 Secondary malignant neoplasm of liver and intrahepatic bile duct: Secondary | ICD-10-CM | POA: Diagnosis not present

## 2015-11-04 DIAGNOSIS — C189 Malignant neoplasm of colon, unspecified: Secondary | ICD-10-CM | POA: Diagnosis not present

## 2015-11-04 LAB — GLUCOSE, CAPILLARY: Glucose-Capillary: 113 mg/dL — ABNORMAL HIGH (ref 65–99)

## 2015-11-04 MED ORDER — FLUDEOXYGLUCOSE F - 18 (FDG) INJECTION
7.6000 | Freq: Once | INTRAVENOUS | Status: DC | PRN
  Administered 2015-11-04: 7.6 via INTRAVENOUS
  Filled 2015-11-04: qty 7.6

## 2015-11-07 DIAGNOSIS — C189 Malignant neoplasm of colon, unspecified: Secondary | ICD-10-CM | POA: Diagnosis not present

## 2015-11-07 DIAGNOSIS — T451X5A Adverse effect of antineoplastic and immunosuppressive drugs, initial encounter: Secondary | ICD-10-CM | POA: Diagnosis not present

## 2015-11-07 DIAGNOSIS — C787 Secondary malignant neoplasm of liver and intrahepatic bile duct: Secondary | ICD-10-CM | POA: Diagnosis not present

## 2015-11-07 DIAGNOSIS — G620 Drug-induced polyneuropathy: Secondary | ICD-10-CM | POA: Diagnosis not present

## 2015-11-07 DIAGNOSIS — Z95828 Presence of other vascular implants and grafts: Secondary | ICD-10-CM | POA: Diagnosis not present

## 2015-12-16 DIAGNOSIS — C189 Malignant neoplasm of colon, unspecified: Secondary | ICD-10-CM | POA: Diagnosis not present

## 2015-12-16 DIAGNOSIS — C787 Secondary malignant neoplasm of liver and intrahepatic bile duct: Secondary | ICD-10-CM | POA: Diagnosis not present

## 2015-12-16 DIAGNOSIS — Z95828 Presence of other vascular implants and grafts: Secondary | ICD-10-CM | POA: Diagnosis not present

## 2016-01-30 DIAGNOSIS — C787 Secondary malignant neoplasm of liver and intrahepatic bile duct: Secondary | ICD-10-CM | POA: Diagnosis not present

## 2016-01-30 DIAGNOSIS — C189 Malignant neoplasm of colon, unspecified: Secondary | ICD-10-CM | POA: Diagnosis not present

## 2016-02-07 DIAGNOSIS — R110 Nausea: Secondary | ICD-10-CM | POA: Diagnosis not present

## 2016-02-07 DIAGNOSIS — R69 Illness, unspecified: Secondary | ICD-10-CM | POA: Diagnosis not present

## 2016-02-07 DIAGNOSIS — G4700 Insomnia, unspecified: Secondary | ICD-10-CM | POA: Diagnosis not present

## 2016-02-07 DIAGNOSIS — K5900 Constipation, unspecified: Secondary | ICD-10-CM | POA: Diagnosis not present

## 2016-02-07 DIAGNOSIS — Z85038 Personal history of other malignant neoplasm of large intestine: Secondary | ICD-10-CM | POA: Diagnosis not present

## 2016-02-20 DIAGNOSIS — C189 Malignant neoplasm of colon, unspecified: Secondary | ICD-10-CM | POA: Diagnosis not present

## 2016-02-20 DIAGNOSIS — Z95828 Presence of other vascular implants and grafts: Secondary | ICD-10-CM | POA: Diagnosis not present

## 2016-02-20 DIAGNOSIS — J439 Emphysema, unspecified: Secondary | ICD-10-CM | POA: Diagnosis not present

## 2016-02-20 DIAGNOSIS — C787 Secondary malignant neoplasm of liver and intrahepatic bile duct: Secondary | ICD-10-CM | POA: Diagnosis not present

## 2016-03-27 IMAGING — US IR ANGIO/VISCERAL SELECTIVE EA VESSEL WO/W FLUSH
1 series · 1 of 1 positions shown · non-contrast
Comparison: none

CLINICAL DATA: Metastatic colorectal cancer. [AGE] treatment. Right
lobe lesion.

[Series 1: ir embo tumor organ ischemia infarct inc guide roa · 1 of 1 slices shown]
[im 1/1]
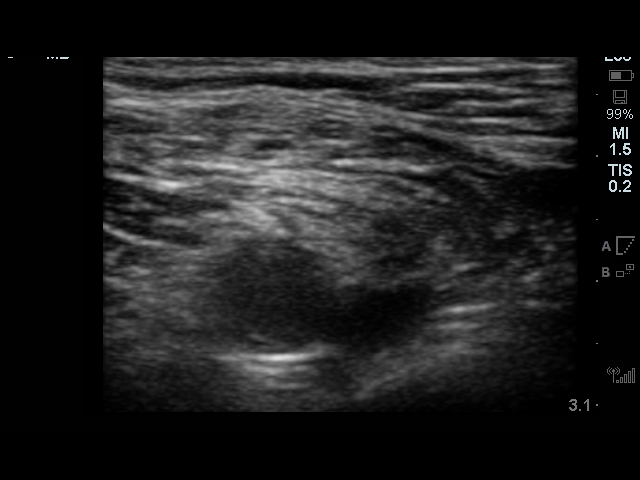

[1 of 1 positions shown; findings below may reference images not displayed]

EXAM:
IR EMBO TUMOR ORGAN ISCHEMIA INFARCT INC GUIDE ROADMAPPING;
ADDITIONAL ARTERIOGRAPHY; IR ULTRASOUND GUIDANCE VASC ACCESS RIGHT;
SELECTIVE VISCERAL ARTERIOGRAPHY

FLUOROSCOPY TIME:  9 minutes 36 seconds.

MEDICATIONS AND MEDICAL HISTORY:
Versed 1 mg, Fentanyl 50 mcg.

As antibiotic prophylaxis, Ancef was ordered pre-procedure and
administered intravenously within one hour of incision.

ANESTHESIA/SEDATION:
Moderate sedation time: 52 minutes

CONTRAST:  100 cc Omnipaque 300

PROCEDURE:
The procedure, risks, benefits, and alternatives were explained to
the patient. Questions regarding the procedure were encouraged and
answered. The patient understands and consents to the procedure.

The right groin was prepped with Betadine in a sterile fashion, and
a sterile drape was applied covering the operative field. A sterile
gown and sterile gloves were used for the procedure.

Under sonographic guidance, a micropuncture needle was inserted into
the right common femoral artery and removed over a 018 wire which
was up sized to Felicitas Cripe. A 5 French sheath was inserted.

A Chung 2.5 catheter was advanced into the descending thoracic
aorta. The curve was created without difficulty. The celiac was
selected. Angiography was performed.

A micro catheter was advanced through the Chung catheter and into
the right hepatic artery. Angiography was performed. The catheter
was advanced to the distal right hepatic artery and a final
angiogram was performed

[AGE] embolization into the right lobe of the liver are was performed
per protocol. 29 mCi was administered.

Post embolization imaging was performed with contrast injection into
the catheter

The catheter was flushed. The micro catheter and shunt catheter were
removed without complication and placed into the radiation safety
receptacle.

Femoral angiogram was performed for Exoseal. Blain Jumper was deployed
without complication and hemostasis was achieved.
FINDINGS: Hepatic angiography delineates arterial anatomy and patency of the
right hepatic artery. The gastroduodenal artery remains occluded.

Post embolization angiogram of the right hepatic artery confirms
patency.

COMPLICATIONS:
None
IMPRESSION: Successful [AGE] radiation embolic therapy for metastatic colorectal
disease in the right lobe of the liver.

## 2016-04-02 DIAGNOSIS — Z95828 Presence of other vascular implants and grafts: Secondary | ICD-10-CM | POA: Diagnosis not present

## 2016-05-01 ENCOUNTER — Other Ambulatory Visit: Payer: Self-pay | Admitting: *Deleted

## 2016-05-08 DIAGNOSIS — Z85038 Personal history of other malignant neoplasm of large intestine: Secondary | ICD-10-CM | POA: Diagnosis not present

## 2016-05-08 DIAGNOSIS — R17 Unspecified jaundice: Secondary | ICD-10-CM | POA: Diagnosis not present

## 2016-05-08 DIAGNOSIS — R3911 Hesitancy of micturition: Secondary | ICD-10-CM | POA: Diagnosis not present

## 2016-05-14 DIAGNOSIS — C189 Malignant neoplasm of colon, unspecified: Secondary | ICD-10-CM | POA: Diagnosis not present

## 2016-05-14 DIAGNOSIS — C787 Secondary malignant neoplasm of liver and intrahepatic bile duct: Secondary | ICD-10-CM | POA: Diagnosis not present

## 2016-05-15 ENCOUNTER — Other Ambulatory Visit (HOSPITAL_COMMUNITY): Payer: Self-pay | Admitting: Oncology

## 2016-05-15 DIAGNOSIS — C189 Malignant neoplasm of colon, unspecified: Secondary | ICD-10-CM

## 2016-05-15 DIAGNOSIS — C787 Secondary malignant neoplasm of liver and intrahepatic bile duct: Principal | ICD-10-CM

## 2016-05-15 DIAGNOSIS — K831 Obstruction of bile duct: Secondary | ICD-10-CM | POA: Diagnosis not present

## 2016-05-15 DIAGNOSIS — R17 Unspecified jaundice: Secondary | ICD-10-CM | POA: Diagnosis not present

## 2016-05-16 ENCOUNTER — Other Ambulatory Visit: Payer: Self-pay | Admitting: Radiology

## 2016-05-16 ENCOUNTER — Ambulatory Visit (HOSPITAL_COMMUNITY)
Admission: RE | Admit: 2016-05-16 | Discharge: 2016-05-16 | Disposition: A | Discharge status: Home / Self Care | Payer: Medicare HMO | Source: Ambulatory Visit | Attending: Oncology | Admitting: Oncology

## 2016-05-16 ENCOUNTER — Encounter (HOSPITAL_COMMUNITY): Payer: Self-pay

## 2016-05-16 ENCOUNTER — Other Ambulatory Visit (HOSPITAL_COMMUNITY): Payer: Self-pay | Admitting: Oncology

## 2016-05-16 DIAGNOSIS — C189 Malignant neoplasm of colon, unspecified: Secondary | ICD-10-CM

## 2016-05-16 DIAGNOSIS — R69 Illness, unspecified: Secondary | ICD-10-CM | POA: Diagnosis not present

## 2016-05-16 DIAGNOSIS — C787 Secondary malignant neoplasm of liver and intrahepatic bile duct: Secondary | ICD-10-CM | POA: Diagnosis not present

## 2016-05-16 DIAGNOSIS — Z85038 Personal history of other malignant neoplasm of large intestine: Secondary | ICD-10-CM | POA: Insufficient documentation

## 2016-05-16 DIAGNOSIS — Z79899 Other long term (current) drug therapy: Secondary | ICD-10-CM | POA: Insufficient documentation

## 2016-05-16 DIAGNOSIS — K831 Obstruction of bile duct: Secondary | ICD-10-CM | POA: Diagnosis not present

## 2016-05-16 DIAGNOSIS — F17210 Nicotine dependence, cigarettes, uncomplicated: Secondary | ICD-10-CM | POA: Insufficient documentation

## 2016-05-16 LAB — CBC
HEMATOCRIT: 31.6 % — AB (ref 39.0–52.0)
HEMOGLOBIN: 11 g/dL — AB (ref 13.0–17.0)
MCH: 30.9 pg (ref 26.0–34.0)
MCHC: 34.8 g/dL (ref 30.0–36.0)
MCV: 88.8 fL (ref 78.0–100.0)
Platelets: 211 10*3/uL (ref 150–400)
RBC: 3.56 MIL/uL — ABNORMAL LOW (ref 4.22–5.81)
RDW: 15 % (ref 11.5–15.5)
WBC: 9.5 10*3/uL (ref 4.0–10.5)

## 2016-05-16 LAB — BASIC METABOLIC PANEL
ANION GAP: 11 (ref 5–15)
BUN: 6 mg/dL (ref 6–20)
CHLORIDE: 98 mmol/L — AB (ref 101–111)
CO2: 26 mmol/L (ref 22–32)
Calcium: 9.2 mg/dL (ref 8.9–10.3)
Creatinine, Ser: 0.79 mg/dL (ref 0.61–1.24)
GFR calc Af Amer: >60 mL/min (ref >60)
GLUCOSE: 117 mg/dL — AB (ref 65–99)
POTASSIUM: 3.9 mmol/L (ref 3.5–5.1)
Sodium: 135 mmol/L (ref 135–145)

## 2016-05-16 LAB — APTT: aPTT: 32 seconds (ref 24–37)

## 2016-05-16 LAB — PROTIME-INR
INR: 1.19 (ref 0.00–1.49)
Prothrombin Time: 15.2 seconds (ref 11.6–15.2)

## 2016-05-16 MED ORDER — LIDOCAINE HCL 1 % IJ SOLN
INTRAMUSCULAR | Status: AC
  Administered 2016-05-16: 10 mL
  Filled 2016-05-16: qty 20

## 2016-05-16 MED ORDER — ATROPINE SULFATE 1 MG/10ML IJ SOSY
PREFILLED_SYRINGE | INTRAMUSCULAR | Status: AC
  Filled 2016-05-16: qty 10

## 2016-05-16 MED ORDER — ATROPINE SULFATE 1 MG/10ML IJ SOSY
PREFILLED_SYRINGE | INTRAMUSCULAR | Status: AC | PRN
  Administered 2016-05-16: 0.5 mg via INTRAVENOUS

## 2016-05-16 MED ORDER — PIPERACILLIN-TAZOBACTAM 3.375 G IVPB
3.3750 g | INTRAVENOUS | Status: DC
  Filled 2016-05-16: qty 50

## 2016-05-16 MED ORDER — IOPAMIDOL (ISOVUE-300) INJECTION 61%
INTRAVENOUS | Status: AC
  Administered 2016-05-16: 10 mL
  Filled 2016-05-16: qty 50

## 2016-05-16 MED ORDER — FENTANYL CITRATE (PF) 100 MCG/2ML IJ SOLN
INTRAMUSCULAR | Status: AC | PRN
  Administered 2016-05-16: 50 ug via INTRAVENOUS

## 2016-05-16 MED ORDER — FENTANYL CITRATE (PF) 100 MCG/2ML IJ SOLN
INTRAMUSCULAR | Status: AC
  Filled 2016-05-16: qty 2

## 2016-05-16 MED ORDER — SODIUM CHLORIDE 0.9 % IV SOLN: Freq: Once | INTRAVENOUS | Status: DC

## 2016-05-16 MED ORDER — MIDAZOLAM HCL 2 MG/2ML IJ SOLN
INTRAMUSCULAR | Status: AC
  Filled 2016-05-16: qty 2

## 2016-05-16 MED ORDER — NALOXONE HCL 0.4 MG/ML IJ SOLN
INTRAMUSCULAR | Status: AC
  Filled 2016-05-16: qty 1

## 2016-05-16 MED ORDER — FLUMAZENIL 0.5 MG/5ML IV SOLN
INTRAVENOUS | Status: AC
  Filled 2016-05-16: qty 5

## 2016-05-16 MED ORDER — HYDROCODONE-ACETAMINOPHEN 5-325 MG PO TABS: 1.0000 | ORAL_TABLET | ORAL | Status: DC | PRN

## 2016-05-16 MED ORDER — MIDAZOLAM HCL 2 MG/2ML IJ SOLN
INTRAMUSCULAR | Status: AC | PRN
  Administered 2016-05-16: 1 mg via INTRAVENOUS

## 2016-05-16 NOTE — Procedures (Signed)
PTC/PBD via L lobe 36F int/ext placed No complication No blood loss. See complete dictation in [facility].

## 2016-05-16 NOTE — Sedation Documentation (Signed)
Time out done at 1146. EPIC crashed.

## 2016-05-16 NOTE — Sedation Documentation (Signed)
MD at bedside.  Dr Vernard Gambles in ,procedure explained, questions answered.  Awaiting room readiness.  Pt and family aware.

## 2016-05-16 NOTE — Discharge Instructions (Signed)
Biliary Drainage Catheter Placement, Care After Refer to this sheet in the next few weeks. These instructions provide you with information on caring for yourself after your procedure. Your health care provider may also give you more specific instructions. Your treatment has been planned according to current medical practices, but problems sometimes occur. Call your health care provider if you have any problems or questions after your procedure. WHAT TO EXPECT AFTER THE PROCEDURE After your procedure, it is typical to have the following:  Pain or soreness at the catheter insertion site.  Drowsiness for several hours after the procedure.  Some bruising at the catheter insertion site.  Drainage into the collection bag on the outside of your body, if you have an external drainage catheter. You might see bloody discharge in the bag for the first day or two. This should turn a yellow-green color soon afterward. HOME CARE INSTRUCTIONS   Do not use machinery, drive, or make legal decisions for 24 hours after your procedure.  Have someone drive you home.  Resume your usual diet. Avoid alcoholic beverages for 24 hours after your procedure.  Rest for the remainder of the day.  Only take over-the-counter or prescription medicines for pain, discomfort, or fever as directed by your health care provider. Do not take aspirin or blood thinners unless directed otherwise. This can make bleeding worse.  Clean the tube insertion site as directed by your health care provider.  Take showers, not baths. Avoid pools and hot tubs. Before showering, cover the area with plastic wrap and tape the edges of the plastic wrap to your skin. This is done to keep your skin dry.  Keep the skin around the insertion site dry. If the area gets wet, dry the skin completely.  Keep all follow-up appointments. SEEK MEDICAL CARE IF:   Your pain gets worse and is not relieved with pain medicines after an initial  improvement.  You have any questions about your tube.  Your skin breaks down around the tube.  You have a fever.  You have chills. SEEK IMMEDIATE MEDICAL CARE IF:   Your redness, soreness, or swelling at the tube insertion site gets worse despite good cleaning.  You have leakage of bile around the tube.  Your tube becomes blocked or clogged.  Your catheter is dislodged or comes out.   This information is not intended to replace advice given to you by your health care provider. Make sure you discuss any questions you have with your health care provider.   Document Released: 07/31/2004 Document Revised: 12/22/2013 Document Reviewed: 08/24/2013 Elsevier Interactive Patient Education [employer].

## 2016-05-16 NOTE — H&P (Signed)
Chief Complaint: liver obstruction  Referring Physician:Dr. Everardo All   Supervising Physician: Arne Cleveland  Patient Status:  Out-pt  HPI: [name] Fields is an 80 y.o. male who has a history of colon cancer, s/p resection, who also have liver mets.  He was seen by Dr. Barbie Banner in 2016 and underwent Y-90 treatment.  He has continued to follow up with Dr. Tressie Stalker and noted about a month ago his urine began to get dark.  He then noticed his eyes were turning yellow as well.  A CT scan was ordered two days ago.  This suggested a liver obstruction secondary to a resurgence of his liver lesion.  An MRI was then obtained which revealed an obstruction of the left and right intrahepatic ducts.  It was arranged for the patient to come today to have a PTC drain placed to relieve this obstruction.  The patient has had some weight loss recently, but not significant.  His appetite hasn't been as good as normal, but not bad either.  He denies any abdominal pain.  He admits to yellow eyes and dark urine.  Past Medical History:  Past Medical History  Diagnosis Date  . Colon cancer ([place])   . Arthritis     Past Surgical History:  Past Surgical History  Procedure Laterality Date  . Colon surgery      jan 2013  . Power port Right 2013    Family History: History reviewed. No pertinent family history.  Social History:  reports that he has been smoking Cigarettes.  He has a 50 pack-year smoking history. He does not have any smokeless tobacco history on file. He reports that he drinks about 3.5 oz of alcohol per week. He reports that he does not use illicit drugs.  Allergies: No Known Allergies  Medications:   Medication List    ASK your doctor about these medications        diphenoxylate-atropine 2.5-0.025 MG tablet  Commonly known as:  LOMOTIL  Take 2 tablets by mouth 4 (four) times daily as needed for diarrhea or loose stools (Do not take more than 8 tablets per day.).     famotidine 20  MG tablet  Commonly known as:  PEPCID  Take 1 tablet (20 mg total) by mouth 2 (two) times daily.     HYDROcodone-acetaminophen 5-325 MG tablet  Commonly known as:  NORCO/VICODIN  Take 1 tablet by mouth every 6 (six) hours as needed (For pain.).     naphazoline-pheniramine 0.025-0.3 % ophthalmic solution  Commonly known as:  NAPHCON-A  Place 2 drops into both eyes daily as needed for irritation or allergies.     omeprazole 20 MG capsule  Commonly known as:  PRILOSEC  Take 20 mg by mouth daily as needed (For heartburn or acid reflux.).     prochlorperazine 10 MG tablet  Commonly known as:  COMPAZINE  Take 10 mg by mouth daily as needed for nausea or vomiting.     zolpidem 5 MG tablet  Commonly known as:  AMBIEN  Take 5 mg by mouth at bedtime as needed for sleep.        Please HPI for pertinent positives, otherwise complete 10 system ROS negative.  Mallampati Score: MD Evaluation Airway: WNL Heart: WNL Abdomen: WNL Chest/ Lungs: WNL ASA  Classification: 3 Mallampati/Airway Score: Two  Physical Exam: BP 110/72 mmHg  Pulse 69  Temp(Src) 98.6 F (37 C) (Oral)  Resp 18  Ht '5\' 11"'  (1.803 m)  Wt 142  lb (64.411 kg)  BMI 19.81 kg/m2  SpO2 100% Body mass index is 19.81 kg/(m^2). General: pleasant, thing black male who is laying in bed in NAD HEENT: head is normocephalic, atraumatic.  Sclera are icteric.  PERRL.  Ears and nose without any masses or lesions.  Mouth is pink and moist, poor dentition. Heart: regular, rate, and rhythm.  Normal s1,s2. No obvious murmurs, gallops, or rubs noted.  Palpable radial and pedal pulses bilaterally Lungs: CTAB, no wheezes, rhonchi, or rales noted.  Respiratory effort nonlabored Abd: soft, NT, ND, +BS, no masses, hernias, or organomegaly Skin: warm and dry with no masses, lesions, or rashes, but skin is jaundiced Psych: A&Ox3 with an appropriate affect.   Labs: Results for orders placed or performed during the hospital encounter of  05/16/16 (from the past 48 hour(s))  APTT     Status: None   Collection Time: 05/16/16  8:30 AM  Result Value Ref Range   aPTT 32 24 - 37 seconds  Basic metabolic panel     Status: Abnormal   Collection Time: 05/16/16  8:30 AM  Result Value Ref Range   Sodium 135 135 - 145 mmol/L   Potassium 3.9 3.5 - 5.1 mmol/L   Chloride 98 (L) 101 - 111 mmol/L   CO2 26 22 - 32 mmol/L   Glucose, Bld 117 (H) 65 - 99 mg/dL   BUN 6 6 - 20 mg/dL   Creatinine, Ser 0.79 0.61 - 1.24 mg/dL   Calcium 9.2 8.9 - 10.3 mg/dL   GFR calc non Af Amer >60 >60 mL/min   GFR calc Af Amer >60 >60 mL/min    Comment: (NOTE) The eGFR has been calculated using the CKD EPI equation. This calculation has not been validated in all clinical situations. eGFR's persistently <60 mL/min signify possible Chronic Kidney Disease.    Anion gap 11 5 - 15  CBC     Status: Abnormal   Collection Time: 05/16/16  8:30 AM  Result Value Ref Range   WBC 9.5 4.0 - 10.5 K/uL   RBC 3.56 (L) 4.22 - 5.81 MIL/uL   Hemoglobin 11.0 (L) 13.0 - 17.0 g/dL   HCT 31.6 (L) 39.0 - 52.0 %   MCV 88.8 78.0 - 100.0 fL   MCH 30.9 26.0 - 34.0 pg   MCHC 34.8 30.0 - 36.0 g/dL   RDW 15.0 11.5 - 15.5 %   Platelets 211 150 - 400 K/uL  Protime-INR     Status: None   Collection Time: 05/16/16  8:30 AM  Result Value Ref Range   Prothrombin Time 15.2 11.6 - 15.2 seconds   INR 1.19 0.00 - 1.49    Imaging: No results found.  Assessment/Plan 1. Colonic adenocarcinoma with liver metastasis and hepatic obstruction -the patient's imaging has been reviewed and it appears the patient needs a PTC drain for liver obstruction. -he will be given a dose of zosyn preoperatively -he has been NPO -his labs and vitals have been reviewed -we have a low threshold to keep the patient overnight for observation after this procedure given the type of procedure it is.  Sometimes these procedures can make patient's ill after stirring up the bacteria in the biliary tree,  especially after an obstruction.  All of this was explained to the patient and he understands. -Risks and Benefits discussed with the patient including, but not limited to bleeding, infection, bile leak, sepsis or even death. All of the patient's questions were answered, patient is agreeable to proceed.  Consent signed and in chart.    Thank you for this interesting consult.  I greatly enjoyed meeting Keyon Liller and look forward to participating in their care.  A copy of this report was sent to the requesting provider on this date.  Electronically Signed: Henreitta Cea 05/16/2016, 9:14 AM   I spent a total of    40 Minutes in face to face in clinical consultation, greater than 50% of which was counseling/coordinating care for liver obstruction secondary to hepatic metastasis, PTC drain placement

## 2016-05-16 NOTE — Sedation Documentation (Signed)
Dr. Vernard Gambles aware of vital signs. Will continue to monitor.,

## 2016-05-16 NOTE — Progress Notes (Signed)
Report given to me, care was accepted.

## 2016-05-17 ENCOUNTER — Other Ambulatory Visit: Payer: Self-pay | Admitting: General Surgery

## 2016-05-17 NOTE — Progress Notes (Signed)
Spoke to Dr. Vernard Gambles regarding the findings of gram negative rods in the patient's bile that was noted after PTC drain placement yesterday.  The patient was not ill-appearing, nor had labs consistent with an infection.  We suspect this is a colonization as typical in bile.  No abx therapy felt warranted at this time.  Abrahm Mancia E 12:03 PM 05/17/2016

## 2016-05-19 LAB — BODY FLUID CULTURE

## 2016-05-23 DIAGNOSIS — Z4682 Encounter for fitting and adjustment of non-vascular catheter: Secondary | ICD-10-CM | POA: Diagnosis not present

## 2016-05-23 DIAGNOSIS — C189 Malignant neoplasm of colon, unspecified: Secondary | ICD-10-CM | POA: Diagnosis not present

## 2016-05-23 DIAGNOSIS — C787 Secondary malignant neoplasm of liver and intrahepatic bile duct: Secondary | ICD-10-CM | POA: Diagnosis not present

## 2016-05-25 DIAGNOSIS — C189 Malignant neoplasm of colon, unspecified: Secondary | ICD-10-CM | POA: Diagnosis not present

## 2016-05-25 DIAGNOSIS — Z4682 Encounter for fitting and adjustment of non-vascular catheter: Secondary | ICD-10-CM | POA: Diagnosis not present

## 2016-05-25 DIAGNOSIS — C787 Secondary malignant neoplasm of liver and intrahepatic bile duct: Secondary | ICD-10-CM | POA: Diagnosis not present

## 2016-05-29 DIAGNOSIS — C189 Malignant neoplasm of colon, unspecified: Secondary | ICD-10-CM | POA: Diagnosis not present

## 2016-05-29 DIAGNOSIS — C787 Secondary malignant neoplasm of liver and intrahepatic bile duct: Secondary | ICD-10-CM | POA: Diagnosis not present

## 2016-05-31 DIAGNOSIS — Z4682 Encounter for fitting and adjustment of non-vascular catheter: Secondary | ICD-10-CM | POA: Diagnosis not present

## 2016-05-31 DIAGNOSIS — C189 Malignant neoplasm of colon, unspecified: Secondary | ICD-10-CM | POA: Diagnosis not present

## 2016-05-31 DIAGNOSIS — C787 Secondary malignant neoplasm of liver and intrahepatic bile duct: Secondary | ICD-10-CM | POA: Diagnosis not present

## 2016-06-14 DIAGNOSIS — C787 Secondary malignant neoplasm of liver and intrahepatic bile duct: Secondary | ICD-10-CM | POA: Diagnosis not present

## 2016-06-14 DIAGNOSIS — C189 Malignant neoplasm of colon, unspecified: Secondary | ICD-10-CM | POA: Diagnosis not present

## 2016-06-19 ENCOUNTER — Other Ambulatory Visit (HOSPITAL_COMMUNITY): Payer: Self-pay | Admitting: Interventional Radiology

## 2016-06-19 ENCOUNTER — Telehealth (HOSPITAL_COMMUNITY): Payer: Self-pay

## 2016-06-19 DIAGNOSIS — K831 Obstruction of bile duct: Secondary | ICD-10-CM

## 2016-06-19 DIAGNOSIS — R17 Unspecified jaundice: Secondary | ICD-10-CM | POA: Diagnosis not present

## 2016-06-19 DIAGNOSIS — Z95828 Presence of other vascular implants and grafts: Secondary | ICD-10-CM | POA: Diagnosis not present

## 2016-06-19 DIAGNOSIS — J439 Emphysema, unspecified: Secondary | ICD-10-CM | POA: Diagnosis not present

## 2016-06-19 DIAGNOSIS — C189 Malignant neoplasm of colon, unspecified: Secondary | ICD-10-CM | POA: Diagnosis not present

## 2016-06-19 DIAGNOSIS — C787 Secondary malignant neoplasm of liver and intrahepatic bile duct: Secondary | ICD-10-CM | POA: Diagnosis not present

## 2016-06-19 NOTE — Telephone Encounter (Signed)
Called home phone and cell to schedule radiology procedure, no answer, no vm. Will try back later. AW

## 2016-06-29 ENCOUNTER — Other Ambulatory Visit: Payer: Self-pay | Admitting: General Surgery

## 2016-07-02 ENCOUNTER — Other Ambulatory Visit (HOSPITAL_COMMUNITY): Payer: Self-pay | Admitting: Interventional Radiology

## 2016-07-02 ENCOUNTER — Ambulatory Visit (HOSPITAL_COMMUNITY)
Admission: RE | Admit: 2016-07-02 | Discharge: 2016-07-02 | Disposition: A | Discharge status: Home / Self Care | Payer: Medicare HMO | Source: Ambulatory Visit | Attending: Interventional Radiology | Admitting: Interventional Radiology

## 2016-07-02 ENCOUNTER — Encounter (HOSPITAL_COMMUNITY): Payer: Self-pay

## 2016-07-02 DIAGNOSIS — F17210 Nicotine dependence, cigarettes, uncomplicated: Secondary | ICD-10-CM | POA: Insufficient documentation

## 2016-07-02 DIAGNOSIS — Z4682 Encounter for fitting and adjustment of non-vascular catheter: Secondary | ICD-10-CM | POA: Diagnosis not present

## 2016-07-02 DIAGNOSIS — R69 Illness, unspecified: Secondary | ICD-10-CM | POA: Diagnosis not present

## 2016-07-02 DIAGNOSIS — C787 Secondary malignant neoplasm of liver and intrahepatic bile duct: Secondary | ICD-10-CM | POA: Diagnosis not present

## 2016-07-02 DIAGNOSIS — K831 Obstruction of bile duct: Secondary | ICD-10-CM | POA: Diagnosis not present

## 2016-07-02 DIAGNOSIS — C189 Malignant neoplasm of colon, unspecified: Secondary | ICD-10-CM | POA: Insufficient documentation

## 2016-07-02 DIAGNOSIS — M1990 Unspecified osteoarthritis, unspecified site: Secondary | ICD-10-CM | POA: Insufficient documentation

## 2016-07-02 HISTORY — DX: Secondary malignant neoplasm of liver and intrahepatic bile duct: C78.7

## 2016-07-02 LAB — COMPREHENSIVE METABOLIC PANEL
ALBUMIN: 2.7 g/dL — AB (ref 3.5–5.0)
ALK PHOS: 297 U/L — AB (ref 38–126)
ALT: 30 U/L (ref 17–63)
AST: 48 U/L — ABNORMAL HIGH (ref 15–41)
Anion gap: 7 (ref 5–15)
BUN: <5 mg/dL — ABNORMAL LOW (ref 6–20)
CALCIUM: 8.9 mg/dL (ref 8.9–10.3)
CHLORIDE: 106 mmol/L (ref 101–111)
CO2: 26 mmol/L (ref 22–32)
CREATININE: 0.74 mg/dL (ref 0.61–1.24)
GFR calc Af Amer: >60 mL/min (ref >60)
GFR calc non Af Amer: >60 mL/min (ref >60)
GLUCOSE: 97 mg/dL (ref 65–99)
Potassium: 3.4 mmol/L — ABNORMAL LOW (ref 3.5–5.1)
SODIUM: 139 mmol/L (ref 135–145)
Total Bilirubin: 3.9 mg/dL — ABNORMAL HIGH (ref 0.3–1.2)
Total Protein: 7.1 g/dL (ref 6.5–8.1)

## 2016-07-02 LAB — CBC
HEMATOCRIT: 35.6 % — AB (ref 39.0–52.0)
HEMOGLOBIN: 11.3 g/dL — AB (ref 13.0–17.0)
MCH: 29.5 pg (ref 26.0–34.0)
MCHC: 31.7 g/dL (ref 30.0–36.0)
MCV: 93 fL (ref 78.0–100.0)
Platelets: 137 10*3/uL — ABNORMAL LOW (ref 150–400)
RBC: 3.83 MIL/uL — AB (ref 4.22–5.81)
RDW: 14.1 % (ref 11.5–15.5)
WBC: 6 10*3/uL (ref 4.0–10.5)

## 2016-07-02 LAB — PROTIME-INR
INR: 1.44 (ref 0.00–1.49)
Prothrombin Time: 17.6 seconds — ABNORMAL HIGH (ref 11.6–15.2)

## 2016-07-02 LAB — APTT: aPTT: 30 seconds (ref 24–37)

## 2016-07-02 MED ORDER — HYDROCODONE-ACETAMINOPHEN 5-325 MG PO TABS
1.0000 | ORAL_TABLET | Freq: Once | ORAL | Status: AC
  Administered 2016-07-02: 1 via ORAL

## 2016-07-02 MED ORDER — HYDROCODONE-ACETAMINOPHEN 5-325 MG PO TABS
ORAL_TABLET | ORAL | Status: AC
  Filled 2016-07-02: qty 1

## 2016-07-02 MED ORDER — CIPROFLOXACIN IN D5W 400 MG/200ML IV SOLN
400.0000 mg | Freq: Once | INTRAVENOUS | Status: DC
Start: 2016-07-02 — End: 2016-07-03
  Filled 2016-07-02: qty 200

## 2016-07-02 MED ORDER — FENTANYL CITRATE (PF) 100 MCG/2ML IJ SOLN
INTRAMUSCULAR | Status: AC | PRN
  Administered 2016-07-02: 50 ug via INTRAVENOUS

## 2016-07-02 MED ORDER — SODIUM CHLORIDE 0.9 % IV SOLN
INTRAVENOUS | Status: DC
  Administered 2016-07-02: 09:00:00 via INTRAVENOUS

## 2016-07-02 MED ORDER — MIDAZOLAM HCL 2 MG/2ML IJ SOLN
INTRAMUSCULAR | Status: AC | PRN
  Administered 2016-07-02: 1 mg via INTRAVENOUS

## 2016-07-02 MED ORDER — LIDOCAINE HCL 1 % IJ SOLN
INTRAMUSCULAR | Status: AC
  Filled 2016-07-02: qty 20

## 2016-07-02 MED ORDER — LIDOCAINE HCL 1 % IJ SOLN
INTRAMUSCULAR | Status: AC | PRN
  Administered 2016-07-02: 10 mL

## 2016-07-02 MED ORDER — IOPAMIDOL (ISOVUE-300) INJECTION 61%
INTRAVENOUS | Status: AC
  Administered 2016-07-02: 15 mL
  Filled 2016-07-02: qty 50

## 2016-07-02 MED ORDER — FENTANYL CITRATE (PF) 100 MCG/2ML IJ SOLN
INTRAMUSCULAR | Status: AC
  Filled 2016-07-02: qty 2

## 2016-07-02 MED ORDER — MIDAZOLAM HCL 2 MG/2ML IJ SOLN
INTRAMUSCULAR | Status: AC
  Filled 2016-07-02: qty 2

## 2016-07-02 NOTE — Procedures (Signed)
L int/ext 10 Fr biliary drain exchange No comp/EBL

## 2016-07-02 NOTE — H&P (Signed)
Chief Complaint: Patient was seen in consultation today for cholangiogram with possible internal/external biliary drain exchange with possible biliary stent placement at the request of Dr Everardo All  Referring Physician(s): Dr Everardo All  Supervising Physician: Marybelle Killings  Patient Status: Outpatient  History of Present Illness: [name] Fields is a 80 y.o. male   Pt with Hx Colon Ca; resection Liver metastasis Y90 procedure performed 01/2015 with Dr Barbie Banner Noted jaundice and dark urine 1 yr later Liver lesion with obstruction noted 04/2016 on CT Internal/External biliary drain placed in IR 05/16/16 Scheduled today for cholangiogram with possible biliary drain exchange versus possible biliary stent placement.  Past Medical History  Diagnosis Date  . Colon cancer ([place])   . Arthritis   . Metastasis to liver [facility])     Hx Colon Ca    Past Surgical History  Procedure Laterality Date  . Colon surgery      jan 2013  . Power port Right 2013    Allergies: Review of patient's allergies indicates no known allergies.  Medications: Prior to Admission medications   Medication Sig Start Date End Date Taking? Authorizing Provider  diphenoxylate-atropine (LOMOTIL) 2.5-0.025 MG per tablet Take 2 tablets by mouth 4 (four) times daily as needed for diarrhea or loose stools (Do not take more than 8 tablets per day.).  03/03/14  Yes Historical Provider, MD  HYDROcodone-acetaminophen (NORCO/VICODIN) 5-325 MG per tablet Take 1 tablet by mouth every 6 (six) hours as needed for moderate pain or severe pain.  09/25/12  Yes Historical Provider, MD  naphazoline-pheniramine (NAPHCON-A) 0.025-0.3 % ophthalmic solution Place 2 drops into both eyes daily as needed for irritation or allergies.    Yes Historical Provider, MD  omeprazole (PRILOSEC) 20 MG capsule Take 20 mg by mouth daily as needed (For heartburn or acid reflux.).  04/06/14  Yes Historical Provider, MD  prochlorperazine (COMPAZINE) 10 MG  tablet Take 10 mg by mouth daily as needed for nausea or vomiting.  03/12/14  Yes Historical Provider, MD  zolpidem (AMBIEN) 5 MG tablet Take 5 mg by mouth at bedtime as needed for sleep.  11/17/14  Yes Historical Provider, MD     History reviewed. No pertinent family history.  Social History   Social History  . Marital Status: Widowed    Spouse Name: N/A  . Number of Children: N/A  . Years of Education: N/A   Social History Main Topics  . Smoking status: Current Every Day Smoker -- 1.00 packs/day for 50 years    Types: Cigarettes  . Smokeless tobacco: None  . Alcohol Use: 3.5 oz/week    7 Standard drinks or equivalent per week     Comment: 2-3 times/week  . Drug Use: No  . Sexual Activity: Not Asked   Other Topics Concern  . None   Social History Narrative     Review of Systems: A 12 point ROS discussed and pertinent positives are indicated in the HPI above.  All other systems are negative.  Review of Systems  Constitutional: Positive for activity change, appetite change, fatigue and unexpected weight change. Negative for fever.  Respiratory: Negative for shortness of breath.   Gastrointestinal: Positive for nausea. Negative for abdominal pain and abdominal distention.  Musculoskeletal: Negative for back pain.  Neurological: Positive for weakness.  Psychiatric/Behavioral: Negative for behavioral problems and confusion.    Vital Signs: BP 136/78 mmHg  Pulse 62  Temp(Src) 98 F (36.7 C) (Oral)  Resp 18  Ht 5\' 11"  (1.803 m)  Wt 144 lb (65.318 kg)  BMI 20.09 kg/m2  SpO2 100%  Physical Exam  Constitutional: He is oriented to person, place, and time.  Eyes: Scleral icterus is present.  Cardiovascular: Normal rate, regular rhythm and normal heart sounds.   Pulmonary/Chest: Effort normal and breath sounds normal. He has no wheezes.  Abdominal: Soft. Bowel sounds are normal. There is no tenderness.  Musculoskeletal: Normal range of motion.  Neurological: He is alert  and oriented to person, place, and time.  Skin: Skin is warm and dry.  Psychiatric: He has a normal mood and affect. His behavior is normal. Judgment and thought content normal.  Nursing note and vitals reviewed.   Mallampati Score:  MD Evaluation Airway: WNL Heart: WNL Abdomen: WNL Chest/ Lungs: WNL ASA  Classification: 3 Mallampati/Airway Score: One  Imaging: No results found.  Labs:  CBC:  Recent Labs  05/16/16 0830  WBC 9.5  HGB 11.0*  HCT 31.6*  PLT 211    COAGS:  Recent Labs  05/16/16 0830  INR 1.19  APTT 32    BMP:  Recent Labs  05/16/16 0830  NA 135  K 3.9  CL 98*  CO2 26  GLUCOSE 117*  BUN 6  CALCIUM 9.2  CREATININE 0.79  GFRNONAA >60  GFRAA >60    LIVER FUNCTION TESTS: No results for input(s): BILITOT, AST, ALT, ALKPHOS, PROT, ALBUMIN in the last 8760 hours.  TUMOR MARKERS: No results for input(s): AFPTM, CEA, CA199, CHROMGRNA in the last 8760 hours.  Assessment and Plan:  Hx Colon Ca Mets to liver Y90 treatment 2016 Liver recurrence with obstructive jaundice 04/2016 Internal/external biliary drain placed 05/16/16 Now scheduled for cholangiogram with possible drain exchange vs biliary stent placement Risks and Benefits discussed with the patient including, but not limited to bleeding, infection which may lead to sepsis or even death and damage to adjacent structures. All of the patient's questions were answered, patient is agreeable to proceed. Consent signed and in chart.   Thank you for this interesting consult.  I greatly enjoyed meeting [name] Fields and look forward to participating in their care.  A copy of this report was sent to the requesting provider on this date.  Electronically Signed: Jameson Tormey A 07/02/2016, 8:29 AM   I spent a total of  30 Minutes   in face to face in clinical consultation, greater than 50% of which was counseling/coordinating care for cholangiogram/biliary stent/drain exchange

## 2016-07-02 NOTE — Discharge Instructions (Signed)
CALL [phone] IF ANY PROBLEMS,QUESTIONS, OR CONCERNS; CALL IF ANY BLEEDING,DRAINAGE,FEVER,PAIN,SWELLING, OR REDNESS AT ABDOMINAL DRESSING SITE

## 2016-07-10 ENCOUNTER — Other Ambulatory Visit (HOSPITAL_COMMUNITY): Payer: Self-pay | Admitting: Interventional Radiology

## 2016-07-10 DIAGNOSIS — C787 Secondary malignant neoplasm of liver and intrahepatic bile duct: Principal | ICD-10-CM

## 2016-07-10 DIAGNOSIS — C189 Malignant neoplasm of colon, unspecified: Secondary | ICD-10-CM

## 2016-07-17 DIAGNOSIS — Z923 Personal history of irradiation: Secondary | ICD-10-CM | POA: Diagnosis not present

## 2016-07-17 DIAGNOSIS — R1012 Left upper quadrant pain: Secondary | ICD-10-CM | POA: Diagnosis not present

## 2016-07-17 DIAGNOSIS — R110 Nausea: Secondary | ICD-10-CM | POA: Diagnosis not present

## 2016-07-17 DIAGNOSIS — R1011 Right upper quadrant pain: Secondary | ICD-10-CM | POA: Diagnosis not present

## 2016-07-17 DIAGNOSIS — C787 Secondary malignant neoplasm of liver and intrahepatic bile duct: Secondary | ICD-10-CM | POA: Diagnosis not present

## 2016-07-17 DIAGNOSIS — R600 Localized edema: Secondary | ICD-10-CM | POA: Diagnosis not present

## 2016-07-17 DIAGNOSIS — C182 Malignant neoplasm of ascending colon: Secondary | ICD-10-CM | POA: Diagnosis not present

## 2016-07-17 DIAGNOSIS — Z931 Gastrostomy status: Secondary | ICD-10-CM | POA: Diagnosis not present

## 2016-07-17 DIAGNOSIS — Z9221 Personal history of antineoplastic chemotherapy: Secondary | ICD-10-CM | POA: Diagnosis not present

## 2016-07-17 DIAGNOSIS — K838 Other specified diseases of biliary tract: Secondary | ICD-10-CM | POA: Diagnosis not present

## 2016-07-17 DIAGNOSIS — C189 Malignant neoplasm of colon, unspecified: Secondary | ICD-10-CM | POA: Diagnosis not present

## 2016-07-17 DIAGNOSIS — T451X5A Adverse effect of antineoplastic and immunosuppressive drugs, initial encounter: Secondary | ICD-10-CM | POA: Diagnosis not present

## 2016-07-17 DIAGNOSIS — D6481 Anemia due to antineoplastic chemotherapy: Secondary | ICD-10-CM | POA: Diagnosis not present

## 2016-07-17 DIAGNOSIS — Z9049 Acquired absence of other specified parts of digestive tract: Secondary | ICD-10-CM | POA: Diagnosis not present

## 2016-07-29 DIAGNOSIS — R180 Malignant ascites: Secondary | ICD-10-CM | POA: Diagnosis not present

## 2016-07-29 DIAGNOSIS — C189 Malignant neoplasm of colon, unspecified: Secondary | ICD-10-CM | POA: Diagnosis not present

## 2016-07-29 DIAGNOSIS — R109 Unspecified abdominal pain: Secondary | ICD-10-CM | POA: Diagnosis not present

## 2016-07-29 DIAGNOSIS — Z6831 Body mass index (BMI) 31.0-31.9, adult: Secondary | ICD-10-CM | POA: Diagnosis not present

## 2016-07-29 DIAGNOSIS — K5669 Other intestinal obstruction: Secondary | ICD-10-CM | POA: Diagnosis not present

## 2016-07-29 DIAGNOSIS — C482 Malignant neoplasm of peritoneum, unspecified: Secondary | ICD-10-CM | POA: Diagnosis not present

## 2016-07-29 DIAGNOSIS — C787 Secondary malignant neoplasm of liver and intrahepatic bile duct: Secondary | ICD-10-CM | POA: Diagnosis not present

## 2016-07-29 DIAGNOSIS — R64 Cachexia: Secondary | ICD-10-CM | POA: Diagnosis not present

## 2016-07-29 DIAGNOSIS — R1084 Generalized abdominal pain: Secondary | ICD-10-CM | POA: Diagnosis not present

## 2016-07-29 DIAGNOSIS — Z9049 Acquired absence of other specified parts of digestive tract: Secondary | ICD-10-CM | POA: Diagnosis not present

## 2016-07-29 DIAGNOSIS — Z6820 Body mass index (BMI) 20.0-20.9, adult: Secondary | ICD-10-CM | POA: Diagnosis not present

## 2016-07-29 DIAGNOSIS — Z85038 Personal history of other malignant neoplasm of large intestine: Secondary | ICD-10-CM | POA: Diagnosis not present

## 2016-07-29 DIAGNOSIS — D649 Anemia, unspecified: Secondary | ICD-10-CM | POA: Diagnosis not present

## 2016-07-29 DIAGNOSIS — C801 Malignant (primary) neoplasm, unspecified: Secondary | ICD-10-CM | POA: Diagnosis not present

## 2016-07-29 DIAGNOSIS — R188 Other ascites: Secondary | ICD-10-CM | POA: Diagnosis not present

## 2016-07-30 DIAGNOSIS — C189 Malignant neoplasm of colon, unspecified: Secondary | ICD-10-CM | POA: Diagnosis not present

## 2016-08-02 DIAGNOSIS — Z6820 Body mass index (BMI) 20.0-20.9, adult: Secondary | ICD-10-CM | POA: Diagnosis not present

## 2016-08-02 DIAGNOSIS — R180 Malignant ascites: Secondary | ICD-10-CM | POA: Diagnosis not present

## 2016-08-02 DIAGNOSIS — Z85038 Personal history of other malignant neoplasm of large intestine: Secondary | ICD-10-CM | POA: Diagnosis not present

## 2016-08-07 DIAGNOSIS — K838 Other specified diseases of biliary tract: Secondary | ICD-10-CM | POA: Diagnosis not present

## 2016-08-07 DIAGNOSIS — T451X5A Adverse effect of antineoplastic and immunosuppressive drugs, initial encounter: Secondary | ICD-10-CM | POA: Diagnosis not present

## 2016-08-07 DIAGNOSIS — Z5181 Encounter for therapeutic drug level monitoring: Secondary | ICD-10-CM | POA: Diagnosis not present

## 2016-08-07 DIAGNOSIS — C189 Malignant neoplasm of colon, unspecified: Secondary | ICD-10-CM | POA: Diagnosis not present

## 2016-08-07 DIAGNOSIS — D500 Iron deficiency anemia secondary to blood loss (chronic): Secondary | ICD-10-CM | POA: Diagnosis not present

## 2016-08-07 DIAGNOSIS — M7989 Other specified soft tissue disorders: Secondary | ICD-10-CM | POA: Diagnosis not present

## 2016-08-07 DIAGNOSIS — Z5111 Encounter for antineoplastic chemotherapy: Secondary | ICD-10-CM | POA: Diagnosis not present

## 2016-08-07 DIAGNOSIS — C787 Secondary malignant neoplasm of liver and intrahepatic bile duct: Secondary | ICD-10-CM | POA: Diagnosis not present

## 2016-08-07 DIAGNOSIS — G620 Drug-induced polyneuropathy: Secondary | ICD-10-CM | POA: Diagnosis not present

## 2016-08-07 DIAGNOSIS — R5383 Other fatigue: Secondary | ICD-10-CM | POA: Diagnosis not present

## 2016-08-07 DIAGNOSIS — R180 Malignant ascites: Secondary | ICD-10-CM | POA: Diagnosis not present

## 2016-08-07 DIAGNOSIS — Z79899 Other long term (current) drug therapy: Secondary | ICD-10-CM | POA: Diagnosis not present

## 2016-08-09 DIAGNOSIS — K5901 Slow transit constipation: Secondary | ICD-10-CM | POA: Diagnosis not present

## 2016-08-09 DIAGNOSIS — Z681 Body mass index (BMI) 19 or less, adult: Secondary | ICD-10-CM | POA: Diagnosis not present

## 2016-08-09 DIAGNOSIS — Z0000 Encounter for general adult medical examination without abnormal findings: Secondary | ICD-10-CM | POA: Diagnosis not present

## 2016-08-14 DIAGNOSIS — Z978 Presence of other specified devices: Secondary | ICD-10-CM | POA: Diagnosis not present

## 2016-08-14 DIAGNOSIS — G620 Drug-induced polyneuropathy: Secondary | ICD-10-CM | POA: Diagnosis not present

## 2016-08-14 DIAGNOSIS — C189 Malignant neoplasm of colon, unspecified: Secondary | ICD-10-CM | POA: Diagnosis not present

## 2016-08-14 DIAGNOSIS — Z5181 Encounter for therapeutic drug level monitoring: Secondary | ICD-10-CM | POA: Diagnosis not present

## 2016-08-14 DIAGNOSIS — D72829 Elevated white blood cell count, unspecified: Secondary | ICD-10-CM | POA: Diagnosis not present

## 2016-08-14 DIAGNOSIS — R600 Localized edema: Secondary | ICD-10-CM | POA: Diagnosis not present

## 2016-08-14 DIAGNOSIS — T451X5A Adverse effect of antineoplastic and immunosuppressive drugs, initial encounter: Secondary | ICD-10-CM | POA: Diagnosis not present

## 2016-08-14 DIAGNOSIS — D500 Iron deficiency anemia secondary to blood loss (chronic): Secondary | ICD-10-CM | POA: Diagnosis not present

## 2016-08-14 DIAGNOSIS — K838 Other specified diseases of biliary tract: Secondary | ICD-10-CM | POA: Diagnosis not present

## 2016-08-14 DIAGNOSIS — C787 Secondary malignant neoplasm of liver and intrahepatic bile duct: Secondary | ICD-10-CM | POA: Diagnosis not present

## 2016-08-14 DIAGNOSIS — Z5111 Encounter for antineoplastic chemotherapy: Secondary | ICD-10-CM | POA: Diagnosis not present

## 2016-08-14 DIAGNOSIS — Z79899 Other long term (current) drug therapy: Secondary | ICD-10-CM | POA: Diagnosis not present

## 2016-08-14 DIAGNOSIS — R110 Nausea: Secondary | ICD-10-CM | POA: Diagnosis not present

## 2016-08-16 DIAGNOSIS — C189 Malignant neoplasm of colon, unspecified: Secondary | ICD-10-CM | POA: Diagnosis not present

## 2016-08-16 DIAGNOSIS — G620 Drug-induced polyneuropathy: Secondary | ICD-10-CM | POA: Diagnosis not present

## 2016-08-16 DIAGNOSIS — K838 Other specified diseases of biliary tract: Secondary | ICD-10-CM | POA: Diagnosis not present

## 2016-08-16 DIAGNOSIS — C787 Secondary malignant neoplasm of liver and intrahepatic bile duct: Secondary | ICD-10-CM | POA: Diagnosis not present

## 2016-08-16 DIAGNOSIS — D500 Iron deficiency anemia secondary to blood loss (chronic): Secondary | ICD-10-CM | POA: Diagnosis not present

## 2016-08-16 DIAGNOSIS — Z5111 Encounter for antineoplastic chemotherapy: Secondary | ICD-10-CM | POA: Diagnosis not present

## 2016-08-16 DIAGNOSIS — T451X5A Adverse effect of antineoplastic and immunosuppressive drugs, initial encounter: Secondary | ICD-10-CM | POA: Diagnosis not present

## 2016-08-24 ENCOUNTER — Other Ambulatory Visit: Payer: Self-pay | Admitting: Radiology

## 2016-08-27 ENCOUNTER — Ambulatory Visit (HOSPITAL_COMMUNITY): Admission: RE | Admit: 2016-08-27 | Payer: Medicare HMO | Source: Ambulatory Visit

## 2016-08-28 DIAGNOSIS — T451X5A Adverse effect of antineoplastic and immunosuppressive drugs, initial encounter: Secondary | ICD-10-CM | POA: Diagnosis not present

## 2016-08-28 DIAGNOSIS — R110 Nausea: Secondary | ICD-10-CM | POA: Diagnosis not present

## 2016-08-28 DIAGNOSIS — Z5111 Encounter for antineoplastic chemotherapy: Secondary | ICD-10-CM | POA: Diagnosis not present

## 2016-08-28 DIAGNOSIS — Z79899 Other long term (current) drug therapy: Secondary | ICD-10-CM | POA: Diagnosis not present

## 2016-08-28 DIAGNOSIS — C787 Secondary malignant neoplasm of liver and intrahepatic bile duct: Secondary | ICD-10-CM | POA: Diagnosis not present

## 2016-08-28 DIAGNOSIS — Z978 Presence of other specified devices: Secondary | ICD-10-CM | POA: Diagnosis not present

## 2016-08-28 DIAGNOSIS — R5383 Other fatigue: Secondary | ICD-10-CM | POA: Diagnosis not present

## 2016-08-28 DIAGNOSIS — K838 Other specified diseases of biliary tract: Secondary | ICD-10-CM | POA: Diagnosis not present

## 2016-08-28 DIAGNOSIS — Z5181 Encounter for therapeutic drug level monitoring: Secondary | ICD-10-CM | POA: Diagnosis not present

## 2016-08-28 DIAGNOSIS — G620 Drug-induced polyneuropathy: Secondary | ICD-10-CM | POA: Diagnosis not present

## 2016-08-28 DIAGNOSIS — R109 Unspecified abdominal pain: Secondary | ICD-10-CM | POA: Diagnosis not present

## 2016-08-28 DIAGNOSIS — C189 Malignant neoplasm of colon, unspecified: Secondary | ICD-10-CM | POA: Diagnosis not present

## 2016-08-28 DIAGNOSIS — D500 Iron deficiency anemia secondary to blood loss (chronic): Secondary | ICD-10-CM | POA: Diagnosis not present

## 2016-08-28 DIAGNOSIS — E860 Dehydration: Secondary | ICD-10-CM | POA: Diagnosis not present

## 2016-09-04 DIAGNOSIS — Z5111 Encounter for antineoplastic chemotherapy: Secondary | ICD-10-CM | POA: Diagnosis not present

## 2016-09-04 DIAGNOSIS — Z79899 Other long term (current) drug therapy: Secondary | ICD-10-CM | POA: Diagnosis not present

## 2016-09-04 DIAGNOSIS — G620 Drug-induced polyneuropathy: Secondary | ICD-10-CM | POA: Diagnosis not present

## 2016-09-04 DIAGNOSIS — Z5181 Encounter for therapeutic drug level monitoring: Secondary | ICD-10-CM | POA: Diagnosis not present

## 2016-09-04 DIAGNOSIS — K1230 Oral mucositis (ulcerative), unspecified: Secondary | ICD-10-CM | POA: Diagnosis not present

## 2016-09-04 DIAGNOSIS — K838 Other specified diseases of biliary tract: Secondary | ICD-10-CM | POA: Diagnosis not present

## 2016-09-04 DIAGNOSIS — T451X5A Adverse effect of antineoplastic and immunosuppressive drugs, initial encounter: Secondary | ICD-10-CM | POA: Diagnosis not present

## 2016-09-04 DIAGNOSIS — C189 Malignant neoplasm of colon, unspecified: Secondary | ICD-10-CM | POA: Diagnosis not present

## 2016-09-04 DIAGNOSIS — R180 Malignant ascites: Secondary | ICD-10-CM | POA: Diagnosis not present

## 2016-09-04 DIAGNOSIS — D6481 Anemia due to antineoplastic chemotherapy: Secondary | ICD-10-CM | POA: Diagnosis not present

## 2016-09-04 DIAGNOSIS — C787 Secondary malignant neoplasm of liver and intrahepatic bile duct: Secondary | ICD-10-CM | POA: Diagnosis not present

## 2016-09-06 DIAGNOSIS — C189 Malignant neoplasm of colon, unspecified: Secondary | ICD-10-CM | POA: Diagnosis not present

## 2016-09-06 DIAGNOSIS — Z5111 Encounter for antineoplastic chemotherapy: Secondary | ICD-10-CM | POA: Diagnosis not present

## 2016-09-06 DIAGNOSIS — G620 Drug-induced polyneuropathy: Secondary | ICD-10-CM | POA: Diagnosis not present

## 2016-09-06 DIAGNOSIS — K1230 Oral mucositis (ulcerative), unspecified: Secondary | ICD-10-CM | POA: Diagnosis not present

## 2016-09-06 DIAGNOSIS — T451X5A Adverse effect of antineoplastic and immunosuppressive drugs, initial encounter: Secondary | ICD-10-CM | POA: Diagnosis not present

## 2016-09-06 DIAGNOSIS — C787 Secondary malignant neoplasm of liver and intrahepatic bile duct: Secondary | ICD-10-CM | POA: Diagnosis not present

## 2016-09-06 DIAGNOSIS — D6481 Anemia due to antineoplastic chemotherapy: Secondary | ICD-10-CM | POA: Diagnosis not present

## 2016-09-13 DIAGNOSIS — Z0100 Encounter for examination of eyes and vision without abnormal findings: Secondary | ICD-10-CM | POA: Diagnosis not present

## 2016-09-13 DIAGNOSIS — H524 Presbyopia: Secondary | ICD-10-CM | POA: Diagnosis not present

## 2016-09-18 DIAGNOSIS — Z5181 Encounter for therapeutic drug level monitoring: Secondary | ICD-10-CM | POA: Diagnosis not present

## 2016-09-18 DIAGNOSIS — Z79899 Other long term (current) drug therapy: Secondary | ICD-10-CM | POA: Diagnosis not present

## 2016-09-18 DIAGNOSIS — R5383 Other fatigue: Secondary | ICD-10-CM | POA: Diagnosis not present

## 2016-09-18 DIAGNOSIS — Z5111 Encounter for antineoplastic chemotherapy: Secondary | ICD-10-CM | POA: Diagnosis not present

## 2016-09-18 DIAGNOSIS — C189 Malignant neoplasm of colon, unspecified: Secondary | ICD-10-CM | POA: Diagnosis not present

## 2016-09-18 DIAGNOSIS — G893 Neoplasm related pain (acute) (chronic): Secondary | ICD-10-CM | POA: Diagnosis not present

## 2016-09-18 DIAGNOSIS — D649 Anemia, unspecified: Secondary | ICD-10-CM | POA: Diagnosis not present

## 2016-09-18 DIAGNOSIS — K1230 Oral mucositis (ulcerative), unspecified: Secondary | ICD-10-CM | POA: Diagnosis not present

## 2016-09-18 DIAGNOSIS — G620 Drug-induced polyneuropathy: Secondary | ICD-10-CM | POA: Diagnosis not present

## 2016-09-18 DIAGNOSIS — C787 Secondary malignant neoplasm of liver and intrahepatic bile duct: Secondary | ICD-10-CM | POA: Diagnosis not present

## 2016-09-18 DIAGNOSIS — D6481 Anemia due to antineoplastic chemotherapy: Secondary | ICD-10-CM | POA: Diagnosis not present

## 2016-09-18 DIAGNOSIS — T451X5A Adverse effect of antineoplastic and immunosuppressive drugs, initial encounter: Secondary | ICD-10-CM | POA: Diagnosis not present

## 2016-09-25 DIAGNOSIS — G620 Drug-induced polyneuropathy: Secondary | ICD-10-CM | POA: Diagnosis not present

## 2016-09-25 DIAGNOSIS — D649 Anemia, unspecified: Secondary | ICD-10-CM | POA: Diagnosis not present

## 2016-09-25 DIAGNOSIS — C787 Secondary malignant neoplasm of liver and intrahepatic bile duct: Secondary | ICD-10-CM | POA: Diagnosis not present

## 2016-09-25 DIAGNOSIS — Z79899 Other long term (current) drug therapy: Secondary | ICD-10-CM | POA: Diagnosis not present

## 2016-09-25 DIAGNOSIS — D6481 Anemia due to antineoplastic chemotherapy: Secondary | ICD-10-CM | POA: Diagnosis not present

## 2016-09-25 DIAGNOSIS — K1230 Oral mucositis (ulcerative), unspecified: Secondary | ICD-10-CM | POA: Diagnosis not present

## 2016-09-25 DIAGNOSIS — R5383 Other fatigue: Secondary | ICD-10-CM | POA: Diagnosis not present

## 2016-09-25 DIAGNOSIS — T451X5A Adverse effect of antineoplastic and immunosuppressive drugs, initial encounter: Secondary | ICD-10-CM | POA: Diagnosis not present

## 2016-09-25 DIAGNOSIS — C189 Malignant neoplasm of colon, unspecified: Secondary | ICD-10-CM | POA: Diagnosis not present

## 2016-09-25 DIAGNOSIS — Z5181 Encounter for therapeutic drug level monitoring: Secondary | ICD-10-CM | POA: Diagnosis not present

## 2016-09-25 DIAGNOSIS — Z5111 Encounter for antineoplastic chemotherapy: Secondary | ICD-10-CM | POA: Diagnosis not present

## 2016-09-25 DIAGNOSIS — R69 Illness, unspecified: Secondary | ICD-10-CM | POA: Diagnosis not present

## 2016-10-04 ENCOUNTER — Telehealth (HOSPITAL_COMMUNITY): Payer: Self-pay | Admitting: Radiology

## 2016-10-04 NOTE — Telephone Encounter (Signed)
Have tried to call pt to schedule multiple times. No answer, no VM. JM

## 2016-10-05 ENCOUNTER — Other Ambulatory Visit: Payer: Self-pay | Admitting: Radiology

## 2016-10-08 ENCOUNTER — Encounter (HOSPITAL_COMMUNITY): Payer: Self-pay | Admitting: Interventional Radiology

## 2016-10-08 ENCOUNTER — Ambulatory Visit (HOSPITAL_COMMUNITY)
Admission: RE | Admit: 2016-10-08 | Discharge: 2016-10-08 | Disposition: A | Discharge status: Home / Self Care | Payer: Medicare HMO | Source: Ambulatory Visit | Attending: Interventional Radiology | Admitting: Interventional Radiology

## 2016-10-08 ENCOUNTER — Other Ambulatory Visit (HOSPITAL_COMMUNITY): Payer: Self-pay | Admitting: Interventional Radiology

## 2016-10-08 DIAGNOSIS — C787 Secondary malignant neoplasm of liver and intrahepatic bile duct: Principal | ICD-10-CM

## 2016-10-08 DIAGNOSIS — C189 Malignant neoplasm of colon, unspecified: Secondary | ICD-10-CM | POA: Insufficient documentation

## 2016-10-08 DIAGNOSIS — Z4803 Encounter for change or removal of drains: Secondary | ICD-10-CM | POA: Insufficient documentation

## 2016-10-08 DIAGNOSIS — K831 Obstruction of bile duct: Secondary | ICD-10-CM | POA: Insufficient documentation

## 2016-10-08 DIAGNOSIS — Z436 Encounter for attention to other artificial openings of urinary tract: Secondary | ICD-10-CM | POA: Diagnosis not present

## 2016-10-08 HISTORY — PX: IR GENERIC HISTORICAL: IMG1180011

## 2016-10-08 MED ORDER — CIPROFLOXACIN IN D5W 400 MG/200ML IV SOLN
INTRAVENOUS | Status: AC
  Filled 2016-10-08: qty 200

## 2016-10-08 MED ORDER — LIDOCAINE HCL 1 % IJ SOLN
INTRAMUSCULAR | Status: DC | PRN
  Administered 2016-10-08: 10 mL

## 2016-10-08 MED ORDER — IOPAMIDOL (ISOVUE-300) INJECTION 61%
INTRAVENOUS | Status: AC
  Administered 2016-10-08: 10 mL
  Filled 2016-10-08: qty 50

## 2016-10-08 MED ORDER — HEPARIN SOD (PORK) LOCK FLUSH 100 UNIT/ML IV SOLN
INTRAVENOUS | Status: AC
  Filled 2016-10-08: qty 5

## 2016-10-08 MED ORDER — CIPROFLOXACIN IN D5W 400 MG/200ML IV SOLN
400.0000 mg | Freq: Once | INTRAVENOUS | Status: AC
  Administered 2016-10-08: 400 mg via INTRAVENOUS

## 2016-10-08 MED ORDER — LIDOCAINE HCL 1 % IJ SOLN
INTRAMUSCULAR | Status: AC
  Filled 2016-10-08: qty 20

## 2016-10-08 NOTE — Procedures (Signed)
Exchange int/ext bili drain cath under fluoro No complication No blood loss. See complete dictation in [facility].

## 2016-10-10 DIAGNOSIS — R180 Malignant ascites: Secondary | ICD-10-CM | POA: Diagnosis not present

## 2016-10-10 DIAGNOSIS — J439 Emphysema, unspecified: Secondary | ICD-10-CM | POA: Diagnosis not present

## 2016-10-10 DIAGNOSIS — J90 Pleural effusion, not elsewhere classified: Secondary | ICD-10-CM | POA: Diagnosis not present

## 2016-10-10 DIAGNOSIS — J9811 Atelectasis: Secondary | ICD-10-CM | POA: Diagnosis not present

## 2016-10-10 DIAGNOSIS — C189 Malignant neoplasm of colon, unspecified: Secondary | ICD-10-CM | POA: Diagnosis not present

## 2016-10-10 DIAGNOSIS — C787 Secondary malignant neoplasm of liver and intrahepatic bile duct: Secondary | ICD-10-CM | POA: Diagnosis not present

## 2016-10-14 DIAGNOSIS — C189 Malignant neoplasm of colon, unspecified: Secondary | ICD-10-CM | POA: Diagnosis not present

## 2016-10-23 DIAGNOSIS — G4700 Insomnia, unspecified: Secondary | ICD-10-CM | POA: Diagnosis not present

## 2016-10-23 DIAGNOSIS — Z5181 Encounter for therapeutic drug level monitoring: Secondary | ICD-10-CM | POA: Diagnosis not present

## 2016-10-23 DIAGNOSIS — R531 Weakness: Secondary | ICD-10-CM | POA: Diagnosis not present

## 2016-10-23 DIAGNOSIS — R69 Illness, unspecified: Secondary | ICD-10-CM | POA: Diagnosis not present

## 2016-10-23 DIAGNOSIS — Z95828 Presence of other vascular implants and grafts: Secondary | ICD-10-CM | POA: Diagnosis not present

## 2016-10-23 DIAGNOSIS — C189 Malignant neoplasm of colon, unspecified: Secondary | ICD-10-CM | POA: Diagnosis not present

## 2016-10-23 DIAGNOSIS — Z79899 Other long term (current) drug therapy: Secondary | ICD-10-CM | POA: Diagnosis not present

## 2016-10-23 DIAGNOSIS — G893 Neoplasm related pain (acute) (chronic): Secondary | ICD-10-CM | POA: Diagnosis not present

## 2016-10-23 DIAGNOSIS — C787 Secondary malignant neoplasm of liver and intrahepatic bile duct: Secondary | ICD-10-CM | POA: Diagnosis not present

## 2016-10-23 DIAGNOSIS — K219 Gastro-esophageal reflux disease without esophagitis: Secondary | ICD-10-CM | POA: Diagnosis not present

## 2016-10-23 DIAGNOSIS — D509 Iron deficiency anemia, unspecified: Secondary | ICD-10-CM | POA: Diagnosis not present

## 2016-10-23 DIAGNOSIS — K831 Obstruction of bile duct: Secondary | ICD-10-CM | POA: Diagnosis not present

## 2016-10-30 DIAGNOSIS — K219 Gastro-esophageal reflux disease without esophagitis: Secondary | ICD-10-CM | POA: Diagnosis not present

## 2016-10-30 DIAGNOSIS — Z5181 Encounter for therapeutic drug level monitoring: Secondary | ICD-10-CM | POA: Diagnosis not present

## 2016-10-30 DIAGNOSIS — K831 Obstruction of bile duct: Secondary | ICD-10-CM | POA: Diagnosis not present

## 2016-10-30 DIAGNOSIS — Z95828 Presence of other vascular implants and grafts: Secondary | ICD-10-CM | POA: Diagnosis not present

## 2016-10-30 DIAGNOSIS — C787 Secondary malignant neoplasm of liver and intrahepatic bile duct: Secondary | ICD-10-CM | POA: Diagnosis not present

## 2016-10-30 DIAGNOSIS — G620 Drug-induced polyneuropathy: Secondary | ICD-10-CM | POA: Diagnosis not present

## 2016-10-30 DIAGNOSIS — T451X5A Adverse effect of antineoplastic and immunosuppressive drugs, initial encounter: Secondary | ICD-10-CM | POA: Diagnosis not present

## 2016-10-30 DIAGNOSIS — F17210 Nicotine dependence, cigarettes, uncomplicated: Secondary | ICD-10-CM | POA: Diagnosis not present

## 2016-10-30 DIAGNOSIS — R69 Illness, unspecified: Secondary | ICD-10-CM | POA: Diagnosis not present

## 2016-10-30 DIAGNOSIS — D6481 Anemia due to antineoplastic chemotherapy: Secondary | ICD-10-CM | POA: Diagnosis not present

## 2016-10-30 DIAGNOSIS — G4700 Insomnia, unspecified: Secondary | ICD-10-CM | POA: Diagnosis not present

## 2016-10-30 DIAGNOSIS — Z5111 Encounter for antineoplastic chemotherapy: Secondary | ICD-10-CM | POA: Diagnosis not present

## 2016-10-30 DIAGNOSIS — C189 Malignant neoplasm of colon, unspecified: Secondary | ICD-10-CM | POA: Diagnosis not present

## 2016-10-30 DIAGNOSIS — Z79899 Other long term (current) drug therapy: Secondary | ICD-10-CM | POA: Diagnosis not present

## 2016-10-30 DIAGNOSIS — R600 Localized edema: Secondary | ICD-10-CM | POA: Diagnosis not present

## 2016-11-01 DIAGNOSIS — C787 Secondary malignant neoplasm of liver and intrahepatic bile duct: Secondary | ICD-10-CM | POA: Diagnosis not present

## 2016-11-01 DIAGNOSIS — C189 Malignant neoplasm of colon, unspecified: Secondary | ICD-10-CM | POA: Diagnosis not present

## 2016-11-02 DIAGNOSIS — C189 Malignant neoplasm of colon, unspecified: Secondary | ICD-10-CM | POA: Diagnosis not present

## 2016-11-02 DIAGNOSIS — Z452 Encounter for adjustment and management of vascular access device: Secondary | ICD-10-CM | POA: Diagnosis not present

## 2016-11-09 ENCOUNTER — Other Ambulatory Visit: Payer: Self-pay | Admitting: General Surgery

## 2016-11-12 ENCOUNTER — Inpatient Hospital Stay (HOSPITAL_COMMUNITY): Admission: RE | Admit: 2016-11-12 | Payer: Medicare HMO | Source: Ambulatory Visit

## 2016-11-13 DIAGNOSIS — K831 Obstruction of bile duct: Secondary | ICD-10-CM | POA: Diagnosis not present

## 2016-11-13 DIAGNOSIS — C787 Secondary malignant neoplasm of liver and intrahepatic bile duct: Secondary | ICD-10-CM | POA: Diagnosis not present

## 2016-11-13 DIAGNOSIS — T451X5D Adverse effect of antineoplastic and immunosuppressive drugs, subsequent encounter: Secondary | ICD-10-CM | POA: Diagnosis not present

## 2016-11-13 DIAGNOSIS — D6481 Anemia due to antineoplastic chemotherapy: Secondary | ICD-10-CM | POA: Diagnosis not present

## 2016-11-13 DIAGNOSIS — C189 Malignant neoplasm of colon, unspecified: Secondary | ICD-10-CM | POA: Diagnosis not present

## 2016-11-13 DIAGNOSIS — D508 Other iron deficiency anemias: Secondary | ICD-10-CM | POA: Diagnosis not present

## 2016-11-13 DIAGNOSIS — E876 Hypokalemia: Secondary | ICD-10-CM | POA: Diagnosis not present

## 2016-11-13 DIAGNOSIS — R5381 Other malaise: Secondary | ICD-10-CM | POA: Diagnosis not present

## 2016-11-13 DIAGNOSIS — R180 Malignant ascites: Secondary | ICD-10-CM | POA: Diagnosis not present

## 2016-11-13 DIAGNOSIS — G893 Neoplasm related pain (acute) (chronic): Secondary | ICD-10-CM | POA: Diagnosis not present

## 2016-11-13 DIAGNOSIS — R110 Nausea: Secondary | ICD-10-CM | POA: Diagnosis not present

## 2016-11-13 DIAGNOSIS — Z95828 Presence of other vascular implants and grafts: Secondary | ICD-10-CM | POA: Diagnosis not present

## 2016-11-13 DIAGNOSIS — Z5111 Encounter for antineoplastic chemotherapy: Secondary | ICD-10-CM | POA: Diagnosis not present

## 2016-11-13 DIAGNOSIS — G620 Drug-induced polyneuropathy: Secondary | ICD-10-CM | POA: Diagnosis not present

## 2016-11-13 DIAGNOSIS — T451X5A Adverse effect of antineoplastic and immunosuppressive drugs, initial encounter: Secondary | ICD-10-CM | POA: Diagnosis not present

## 2016-11-14 DIAGNOSIS — C189 Malignant neoplasm of colon, unspecified: Secondary | ICD-10-CM | POA: Diagnosis not present

## 2016-11-15 DIAGNOSIS — T451X5A Adverse effect of antineoplastic and immunosuppressive drugs, initial encounter: Secondary | ICD-10-CM | POA: Diagnosis not present

## 2016-11-15 DIAGNOSIS — E876 Hypokalemia: Secondary | ICD-10-CM | POA: Diagnosis not present

## 2016-11-15 DIAGNOSIS — R180 Malignant ascites: Secondary | ICD-10-CM | POA: Diagnosis not present

## 2016-11-15 DIAGNOSIS — G620 Drug-induced polyneuropathy: Secondary | ICD-10-CM | POA: Diagnosis not present

## 2016-11-15 DIAGNOSIS — Z5111 Encounter for antineoplastic chemotherapy: Secondary | ICD-10-CM | POA: Diagnosis not present

## 2016-11-15 DIAGNOSIS — Z95828 Presence of other vascular implants and grafts: Secondary | ICD-10-CM | POA: Diagnosis not present

## 2016-11-15 DIAGNOSIS — K831 Obstruction of bile duct: Secondary | ICD-10-CM | POA: Diagnosis not present

## 2016-11-15 DIAGNOSIS — C189 Malignant neoplasm of colon, unspecified: Secondary | ICD-10-CM | POA: Diagnosis not present

## 2016-11-15 DIAGNOSIS — C787 Secondary malignant neoplasm of liver and intrahepatic bile duct: Secondary | ICD-10-CM | POA: Diagnosis not present

## 2016-11-15 DIAGNOSIS — D6481 Anemia due to antineoplastic chemotherapy: Secondary | ICD-10-CM | POA: Diagnosis not present

## 2016-11-27 DIAGNOSIS — R180 Malignant ascites: Secondary | ICD-10-CM | POA: Diagnosis not present

## 2016-11-27 DIAGNOSIS — C189 Malignant neoplasm of colon, unspecified: Secondary | ICD-10-CM | POA: Diagnosis not present

## 2016-11-27 DIAGNOSIS — Z79899 Other long term (current) drug therapy: Secondary | ICD-10-CM | POA: Diagnosis not present

## 2016-11-27 DIAGNOSIS — T451X5D Adverse effect of antineoplastic and immunosuppressive drugs, subsequent encounter: Secondary | ICD-10-CM | POA: Diagnosis not present

## 2016-11-27 DIAGNOSIS — G620 Drug-induced polyneuropathy: Secondary | ICD-10-CM | POA: Diagnosis not present

## 2016-11-27 DIAGNOSIS — Z5181 Encounter for therapeutic drug level monitoring: Secondary | ICD-10-CM | POA: Diagnosis not present

## 2016-11-27 DIAGNOSIS — G893 Neoplasm related pain (acute) (chronic): Secondary | ICD-10-CM | POA: Diagnosis not present

## 2016-11-27 DIAGNOSIS — R600 Localized edema: Secondary | ICD-10-CM | POA: Diagnosis not present

## 2016-11-27 DIAGNOSIS — Z95828 Presence of other vascular implants and grafts: Secondary | ICD-10-CM | POA: Diagnosis not present

## 2016-11-27 DIAGNOSIS — D6481 Anemia due to antineoplastic chemotherapy: Secondary | ICD-10-CM | POA: Diagnosis not present

## 2016-11-27 DIAGNOSIS — C787 Secondary malignant neoplasm of liver and intrahepatic bile duct: Secondary | ICD-10-CM | POA: Diagnosis not present

## 2016-11-27 DIAGNOSIS — T451X5A Adverse effect of antineoplastic and immunosuppressive drugs, initial encounter: Secondary | ICD-10-CM | POA: Diagnosis not present

## 2016-11-27 DIAGNOSIS — R110 Nausea: Secondary | ICD-10-CM | POA: Diagnosis not present

## 2016-11-27 DIAGNOSIS — K831 Obstruction of bile duct: Secondary | ICD-10-CM | POA: Diagnosis not present

## 2016-11-27 DIAGNOSIS — Z5111 Encounter for antineoplastic chemotherapy: Secondary | ICD-10-CM | POA: Diagnosis not present

## 2016-11-27 DIAGNOSIS — E876 Hypokalemia: Secondary | ICD-10-CM | POA: Diagnosis not present

## 2016-11-29 DIAGNOSIS — T451X5A Adverse effect of antineoplastic and immunosuppressive drugs, initial encounter: Secondary | ICD-10-CM | POA: Diagnosis not present

## 2016-11-29 DIAGNOSIS — C189 Malignant neoplasm of colon, unspecified: Secondary | ICD-10-CM | POA: Diagnosis not present

## 2016-11-29 DIAGNOSIS — Z95828 Presence of other vascular implants and grafts: Secondary | ICD-10-CM | POA: Diagnosis not present

## 2016-11-29 DIAGNOSIS — E876 Hypokalemia: Secondary | ICD-10-CM | POA: Diagnosis not present

## 2016-11-29 DIAGNOSIS — R180 Malignant ascites: Secondary | ICD-10-CM | POA: Diagnosis not present

## 2016-11-29 DIAGNOSIS — D6481 Anemia due to antineoplastic chemotherapy: Secondary | ICD-10-CM | POA: Diagnosis not present

## 2016-11-29 DIAGNOSIS — G620 Drug-induced polyneuropathy: Secondary | ICD-10-CM | POA: Diagnosis not present

## 2016-11-29 DIAGNOSIS — C787 Secondary malignant neoplasm of liver and intrahepatic bile duct: Secondary | ICD-10-CM | POA: Diagnosis not present

## 2016-11-29 DIAGNOSIS — K831 Obstruction of bile duct: Secondary | ICD-10-CM | POA: Diagnosis not present

## 2016-11-29 DIAGNOSIS — Z5111 Encounter for antineoplastic chemotherapy: Secondary | ICD-10-CM | POA: Diagnosis not present

## 2016-12-11 DIAGNOSIS — Z79899 Other long term (current) drug therapy: Secondary | ICD-10-CM | POA: Diagnosis not present

## 2016-12-11 DIAGNOSIS — G620 Drug-induced polyneuropathy: Secondary | ICD-10-CM | POA: Diagnosis not present

## 2016-12-11 DIAGNOSIS — C189 Malignant neoplasm of colon, unspecified: Secondary | ICD-10-CM | POA: Diagnosis not present

## 2016-12-11 DIAGNOSIS — G893 Neoplasm related pain (acute) (chronic): Secondary | ICD-10-CM | POA: Diagnosis not present

## 2016-12-11 DIAGNOSIS — Z5181 Encounter for therapeutic drug level monitoring: Secondary | ICD-10-CM | POA: Diagnosis not present

## 2016-12-11 DIAGNOSIS — K831 Obstruction of bile duct: Secondary | ICD-10-CM | POA: Diagnosis not present

## 2016-12-11 DIAGNOSIS — T451X5A Adverse effect of antineoplastic and immunosuppressive drugs, initial encounter: Secondary | ICD-10-CM | POA: Diagnosis not present

## 2016-12-11 DIAGNOSIS — R180 Malignant ascites: Secondary | ICD-10-CM | POA: Diagnosis not present

## 2016-12-11 DIAGNOSIS — T451X5D Adverse effect of antineoplastic and immunosuppressive drugs, subsequent encounter: Secondary | ICD-10-CM | POA: Diagnosis not present

## 2016-12-11 DIAGNOSIS — D6481 Anemia due to antineoplastic chemotherapy: Secondary | ICD-10-CM | POA: Diagnosis not present

## 2016-12-11 DIAGNOSIS — C787 Secondary malignant neoplasm of liver and intrahepatic bile duct: Secondary | ICD-10-CM | POA: Diagnosis not present

## 2016-12-11 DIAGNOSIS — D509 Iron deficiency anemia, unspecified: Secondary | ICD-10-CM | POA: Diagnosis not present

## 2016-12-11 DIAGNOSIS — Z5111 Encounter for antineoplastic chemotherapy: Secondary | ICD-10-CM | POA: Diagnosis not present

## 2016-12-13 DIAGNOSIS — D6481 Anemia due to antineoplastic chemotherapy: Secondary | ICD-10-CM | POA: Diagnosis not present

## 2016-12-13 DIAGNOSIS — Z5111 Encounter for antineoplastic chemotherapy: Secondary | ICD-10-CM | POA: Diagnosis not present

## 2016-12-13 DIAGNOSIS — C787 Secondary malignant neoplasm of liver and intrahepatic bile duct: Secondary | ICD-10-CM | POA: Diagnosis not present

## 2016-12-13 DIAGNOSIS — Z79899 Other long term (current) drug therapy: Secondary | ICD-10-CM | POA: Diagnosis not present

## 2016-12-13 DIAGNOSIS — T451X5A Adverse effect of antineoplastic and immunosuppressive drugs, initial encounter: Secondary | ICD-10-CM | POA: Diagnosis not present

## 2016-12-13 DIAGNOSIS — G620 Drug-induced polyneuropathy: Secondary | ICD-10-CM | POA: Diagnosis not present

## 2016-12-13 DIAGNOSIS — C189 Malignant neoplasm of colon, unspecified: Secondary | ICD-10-CM | POA: Diagnosis not present

## 2017-01-02 ENCOUNTER — Encounter: Payer: Self-pay | Admitting: Interventional Radiology

## 2017-01-21 ENCOUNTER — Encounter (HOSPITAL_COMMUNITY): Payer: Self-pay | Admitting: Interventional Radiology

## 2017-01-21 ENCOUNTER — Ambulatory Visit (HOSPITAL_COMMUNITY)
Admission: RE | Admit: 2017-01-21 | Discharge: 2017-01-21 | Disposition: A | Discharge status: Home / Self Care | Payer: Medicare HMO | Source: Ambulatory Visit | Attending: Interventional Radiology | Admitting: Interventional Radiology

## 2017-01-21 ENCOUNTER — Other Ambulatory Visit (HOSPITAL_COMMUNITY): Payer: Self-pay | Admitting: Interventional Radiology

## 2017-01-21 DIAGNOSIS — C189 Malignant neoplasm of colon, unspecified: Secondary | ICD-10-CM | POA: Diagnosis not present

## 2017-01-21 DIAGNOSIS — C787 Secondary malignant neoplasm of liver and intrahepatic bile duct: Principal | ICD-10-CM

## 2017-01-21 DIAGNOSIS — Z4803 Encounter for change or removal of drains: Secondary | ICD-10-CM | POA: Diagnosis present

## 2017-01-21 DIAGNOSIS — Z434 Encounter for attention to other artificial openings of digestive tract: Secondary | ICD-10-CM | POA: Diagnosis not present

## 2017-01-21 DIAGNOSIS — K831 Obstruction of bile duct: Secondary | ICD-10-CM | POA: Diagnosis not present

## 2017-01-21 DIAGNOSIS — R69 Illness, unspecified: Secondary | ICD-10-CM | POA: Diagnosis not present

## 2017-01-21 HISTORY — PX: IR GENERIC HISTORICAL: IMG1180011

## 2017-01-21 MED ORDER — LIDOCAINE HCL 1 % IJ SOLN
INTRAMUSCULAR | Status: AC
  Filled 2017-01-21: qty 20

## 2017-01-21 MED ORDER — IOPAMIDOL (ISOVUE-300) INJECTION 61%
50.0000 mL | Freq: Once | INTRAVENOUS | Status: AC | PRN
  Administered 2017-01-21: 10 mL via INTRAVENOUS

## 2017-01-21 MED ORDER — LIDOCAINE HCL 1 % IJ SOLN
INTRAMUSCULAR | Status: DC | PRN
  Administered 2017-01-21: 5 mL

## 2017-01-21 MED ORDER — IOPAMIDOL (ISOVUE-300) INJECTION 61%
INTRAVENOUS | Status: AC
  Filled 2017-01-21: qty 50

## 2017-01-21 MED FILL — NORMAL SALINE FLUSH SYRINGE: 0.9 | 60 days supply | Qty: 300 | Fill #0

## 2017-01-29 DIAGNOSIS — R180 Malignant ascites: Secondary | ICD-10-CM | POA: Diagnosis not present

## 2017-01-29 DIAGNOSIS — G620 Drug-induced polyneuropathy: Secondary | ICD-10-CM | POA: Diagnosis not present

## 2017-01-29 DIAGNOSIS — D6481 Anemia due to antineoplastic chemotherapy: Secondary | ICD-10-CM | POA: Diagnosis not present

## 2017-01-29 DIAGNOSIS — D509 Iron deficiency anemia, unspecified: Secondary | ICD-10-CM | POA: Diagnosis not present

## 2017-01-29 DIAGNOSIS — C189 Malignant neoplasm of colon, unspecified: Secondary | ICD-10-CM | POA: Diagnosis not present

## 2017-01-29 DIAGNOSIS — G4700 Insomnia, unspecified: Secondary | ICD-10-CM | POA: Diagnosis not present

## 2017-01-29 DIAGNOSIS — G893 Neoplasm related pain (acute) (chronic): Secondary | ICD-10-CM | POA: Diagnosis not present

## 2017-01-29 DIAGNOSIS — Z95828 Presence of other vascular implants and grafts: Secondary | ICD-10-CM | POA: Diagnosis not present

## 2017-01-29 DIAGNOSIS — D6959 Other secondary thrombocytopenia: Secondary | ICD-10-CM | POA: Diagnosis not present

## 2017-01-29 DIAGNOSIS — Z9889 Other specified postprocedural states: Secondary | ICD-10-CM | POA: Diagnosis not present

## 2017-01-29 DIAGNOSIS — C787 Secondary malignant neoplasm of liver and intrahepatic bile duct: Secondary | ICD-10-CM | POA: Diagnosis not present

## 2017-01-29 DIAGNOSIS — K838 Other specified diseases of biliary tract: Secondary | ICD-10-CM | POA: Diagnosis not present

## 2017-01-29 DIAGNOSIS — C182 Malignant neoplasm of ascending colon: Secondary | ICD-10-CM | POA: Diagnosis not present

## 2017-01-29 DIAGNOSIS — Z9049 Acquired absence of other specified parts of digestive tract: Secondary | ICD-10-CM | POA: Diagnosis not present

## 2017-01-29 DIAGNOSIS — R197 Diarrhea, unspecified: Secondary | ICD-10-CM | POA: Diagnosis not present

## 2017-01-29 DIAGNOSIS — T451X5A Adverse effect of antineoplastic and immunosuppressive drugs, initial encounter: Secondary | ICD-10-CM | POA: Diagnosis not present

## 2017-02-05 ENCOUNTER — Other Ambulatory Visit: Payer: Self-pay | Admitting: Radiology

## 2017-02-05 ENCOUNTER — Other Ambulatory Visit (HOSPITAL_COMMUNITY): Payer: Self-pay | Admitting: Interventional Radiology

## 2017-02-05 DIAGNOSIS — C189 Malignant neoplasm of colon, unspecified: Secondary | ICD-10-CM

## 2017-02-05 DIAGNOSIS — C787 Secondary malignant neoplasm of liver and intrahepatic bile duct: Principal | ICD-10-CM

## 2017-02-06 ENCOUNTER — Ambulatory Visit (HOSPITAL_COMMUNITY): Admission: RE | Admit: 2017-02-06 | Payer: Medicare HMO | Source: Ambulatory Visit

## 2017-02-06 DIAGNOSIS — C787 Secondary malignant neoplasm of liver and intrahepatic bile duct: Secondary | ICD-10-CM | POA: Diagnosis not present

## 2017-02-06 DIAGNOSIS — C189 Malignant neoplasm of colon, unspecified: Secondary | ICD-10-CM | POA: Diagnosis not present

## 2017-02-07 DIAGNOSIS — K831 Obstruction of bile duct: Secondary | ICD-10-CM | POA: Diagnosis not present

## 2017-02-07 DIAGNOSIS — J439 Emphysema, unspecified: Secondary | ICD-10-CM | POA: Diagnosis not present

## 2017-02-07 DIAGNOSIS — J449 Chronic obstructive pulmonary disease, unspecified: Secondary | ICD-10-CM | POA: Diagnosis not present

## 2017-02-07 DIAGNOSIS — D509 Iron deficiency anemia, unspecified: Secondary | ICD-10-CM | POA: Diagnosis not present

## 2017-02-07 DIAGNOSIS — R1903 Right lower quadrant abdominal swelling, mass and lump: Secondary | ICD-10-CM | POA: Diagnosis not present

## 2017-02-07 DIAGNOSIS — E876 Hypokalemia: Secondary | ICD-10-CM | POA: Diagnosis not present

## 2017-02-07 DIAGNOSIS — C189 Malignant neoplasm of colon, unspecified: Secondary | ICD-10-CM | POA: Diagnosis not present

## 2017-02-07 DIAGNOSIS — I824Y1 Acute embolism and thrombosis of unspecified deep veins of right proximal lower extremity: Secondary | ICD-10-CM | POA: Diagnosis not present

## 2017-02-07 DIAGNOSIS — M1990 Unspecified osteoarthritis, unspecified site: Secondary | ICD-10-CM | POA: Diagnosis not present

## 2017-02-07 DIAGNOSIS — Z79899 Other long term (current) drug therapy: Secondary | ICD-10-CM | POA: Diagnosis not present

## 2017-02-07 DIAGNOSIS — K651 Peritoneal abscess: Secondary | ICD-10-CM | POA: Diagnosis not present

## 2017-02-07 DIAGNOSIS — C228 Malignant neoplasm of liver, primary, unspecified as to type: Secondary | ICD-10-CM | POA: Diagnosis not present

## 2017-02-07 DIAGNOSIS — R600 Localized edema: Secondary | ICD-10-CM | POA: Diagnosis not present

## 2017-02-07 DIAGNOSIS — I82422 Acute embolism and thrombosis of left iliac vein: Secondary | ICD-10-CM | POA: Diagnosis not present

## 2017-02-07 DIAGNOSIS — C787 Secondary malignant neoplasm of liver and intrahepatic bile duct: Secondary | ICD-10-CM | POA: Diagnosis not present

## 2017-02-07 DIAGNOSIS — D500 Iron deficiency anemia secondary to blood loss (chronic): Secondary | ICD-10-CM | POA: Diagnosis not present

## 2017-02-08 DIAGNOSIS — R1903 Right lower quadrant abdominal swelling, mass and lump: Secondary | ICD-10-CM | POA: Diagnosis not present

## 2017-02-08 DIAGNOSIS — Z7189 Other specified counseling: Secondary | ICD-10-CM | POA: Diagnosis not present

## 2017-02-08 DIAGNOSIS — C787 Secondary malignant neoplasm of liver and intrahepatic bile duct: Secondary | ICD-10-CM | POA: Diagnosis not present

## 2017-02-08 DIAGNOSIS — I824Y1 Acute embolism and thrombosis of unspecified deep veins of right proximal lower extremity: Secondary | ICD-10-CM | POA: Diagnosis not present

## 2017-02-08 DIAGNOSIS — C189 Malignant neoplasm of colon, unspecified: Secondary | ICD-10-CM | POA: Diagnosis not present

## 2017-02-08 DIAGNOSIS — K831 Obstruction of bile duct: Secondary | ICD-10-CM | POA: Diagnosis not present

## 2017-02-08 DIAGNOSIS — G893 Neoplasm related pain (acute) (chronic): Secondary | ICD-10-CM | POA: Diagnosis not present

## 2017-02-08 DIAGNOSIS — Z515 Encounter for palliative care: Secondary | ICD-10-CM | POA: Diagnosis not present

## 2017-02-08 DIAGNOSIS — J439 Emphysema, unspecified: Secondary | ICD-10-CM | POA: Diagnosis not present

## 2017-02-08 DIAGNOSIS — K651 Peritoneal abscess: Secondary | ICD-10-CM | POA: Diagnosis not present

## 2017-02-08 DIAGNOSIS — D500 Iron deficiency anemia secondary to blood loss (chronic): Secondary | ICD-10-CM | POA: Diagnosis not present

## 2017-02-11 ENCOUNTER — Other Ambulatory Visit (HOSPITAL_COMMUNITY): Payer: Self-pay | Admitting: Interventional Radiology

## 2017-02-11 ENCOUNTER — Encounter (HOSPITAL_COMMUNITY): Payer: Self-pay | Admitting: Radiology

## 2017-02-11 ENCOUNTER — Ambulatory Visit (HOSPITAL_COMMUNITY)
Admission: RE | Admit: 2017-02-11 | Discharge: 2017-02-11 | Disposition: A | Discharge status: Home / Self Care | Payer: Medicare HMO | Source: Ambulatory Visit | Attending: Interventional Radiology | Admitting: Interventional Radiology

## 2017-02-11 DIAGNOSIS — K831 Obstruction of bile duct: Secondary | ICD-10-CM

## 2017-02-11 DIAGNOSIS — Z4803 Encounter for change or removal of drains: Secondary | ICD-10-CM | POA: Insufficient documentation

## 2017-02-11 MED ORDER — LIDOCAINE HCL 1 % IJ SOLN
INTRAMUSCULAR | Status: AC | PRN
  Administered 2017-02-11: 10 mL via INTRADERMAL

## 2017-02-11 MED ORDER — IOPAMIDOL (ISOVUE-300) INJECTION 61%
INTRAVENOUS | Status: AC
  Administered 2017-02-11: 15 mL
  Filled 2017-02-11: qty 50

## 2017-02-11 MED ORDER — LIDOCAINE HCL 1 % IJ SOLN
INTRAMUSCULAR | Status: AC
  Filled 2017-02-11: qty 20

## 2017-02-11 NOTE — Procedures (Signed)
Successful exchange of internal-external biliary drain.  No immediate complication.

## 2017-02-12 DIAGNOSIS — T451X5A Adverse effect of antineoplastic and immunosuppressive drugs, initial encounter: Secondary | ICD-10-CM | POA: Diagnosis not present

## 2017-02-12 DIAGNOSIS — C189 Malignant neoplasm of colon, unspecified: Secondary | ICD-10-CM | POA: Diagnosis not present

## 2017-02-12 DIAGNOSIS — Z86718 Personal history of other venous thrombosis and embolism: Secondary | ICD-10-CM | POA: Diagnosis not present

## 2017-02-12 DIAGNOSIS — K831 Obstruction of bile duct: Secondary | ICD-10-CM | POA: Diagnosis not present

## 2017-02-12 DIAGNOSIS — I82403 Acute embolism and thrombosis of unspecified deep veins of lower extremity, bilateral: Secondary | ICD-10-CM | POA: Diagnosis not present

## 2017-02-12 DIAGNOSIS — G893 Neoplasm related pain (acute) (chronic): Secondary | ICD-10-CM | POA: Diagnosis not present

## 2017-02-12 DIAGNOSIS — D701 Agranulocytosis secondary to cancer chemotherapy: Secondary | ICD-10-CM | POA: Diagnosis not present

## 2017-02-12 DIAGNOSIS — C787 Secondary malignant neoplasm of liver and intrahepatic bile duct: Secondary | ICD-10-CM | POA: Diagnosis not present

## 2017-02-12 DIAGNOSIS — R188 Other ascites: Secondary | ICD-10-CM | POA: Diagnosis not present

## 2017-02-12 DIAGNOSIS — D6481 Anemia due to antineoplastic chemotherapy: Secondary | ICD-10-CM | POA: Diagnosis not present

## 2017-02-12 DIAGNOSIS — C182 Malignant neoplasm of ascending colon: Secondary | ICD-10-CM | POA: Diagnosis not present

## 2017-02-12 DIAGNOSIS — I82220 Acute embolism and thrombosis of inferior vena cava: Secondary | ICD-10-CM | POA: Diagnosis not present

## 2017-02-12 DIAGNOSIS — M6281 Muscle weakness (generalized): Secondary | ICD-10-CM | POA: Diagnosis not present

## 2017-02-12 DIAGNOSIS — G620 Drug-induced polyneuropathy: Secondary | ICD-10-CM | POA: Diagnosis not present

## 2017-02-12 DIAGNOSIS — T451X5D Adverse effect of antineoplastic and immunosuppressive drugs, subsequent encounter: Secondary | ICD-10-CM | POA: Diagnosis not present

## 2017-02-12 DIAGNOSIS — Z5111 Encounter for antineoplastic chemotherapy: Secondary | ICD-10-CM | POA: Diagnosis not present

## 2017-02-13 ENCOUNTER — Encounter (HOSPITAL_COMMUNITY): Payer: Self-pay | Admitting: Radiology

## 2017-02-13 ENCOUNTER — Other Ambulatory Visit (HOSPITAL_COMMUNITY): Payer: Self-pay | Admitting: Diagnostic Radiology

## 2017-02-13 ENCOUNTER — Other Ambulatory Visit (HOSPITAL_COMMUNITY): Payer: Self-pay | Admitting: Interventional Radiology

## 2017-02-13 DIAGNOSIS — K831 Obstruction of bile duct: Secondary | ICD-10-CM

## 2017-02-13 DIAGNOSIS — T85590A Other mechanical complication of bile duct prosthesis, initial encounter: Secondary | ICD-10-CM | POA: Diagnosis not present

## 2017-02-13 HISTORY — PX: IR GENERIC HISTORICAL: IMG1180011

## 2017-02-19 DIAGNOSIS — C19 Malignant neoplasm of rectosigmoid junction: Secondary | ICD-10-CM | POA: Diagnosis not present

## 2017-02-19 DIAGNOSIS — C182 Malignant neoplasm of ascending colon: Secondary | ICD-10-CM | POA: Diagnosis not present

## 2017-02-19 DIAGNOSIS — Z888 Allergy status to other drugs, medicaments and biological substances status: Secondary | ICD-10-CM | POA: Diagnosis not present

## 2017-02-19 DIAGNOSIS — I959 Hypotension, unspecified: Secondary | ICD-10-CM | POA: Diagnosis not present

## 2017-02-19 DIAGNOSIS — R188 Other ascites: Secondary | ICD-10-CM | POA: Diagnosis not present

## 2017-02-19 DIAGNOSIS — R0902 Hypoxemia: Secondary | ICD-10-CM | POA: Diagnosis not present

## 2017-02-19 DIAGNOSIS — K219 Gastro-esophageal reflux disease without esophagitis: Secondary | ICD-10-CM | POA: Diagnosis not present

## 2017-02-19 DIAGNOSIS — Z7901 Long term (current) use of anticoagulants: Secondary | ICD-10-CM | POA: Diagnosis not present

## 2017-02-19 DIAGNOSIS — D6481 Anemia due to antineoplastic chemotherapy: Secondary | ICD-10-CM | POA: Diagnosis not present

## 2017-02-19 DIAGNOSIS — C787 Secondary malignant neoplasm of liver and intrahepatic bile duct: Secondary | ICD-10-CM | POA: Diagnosis not present

## 2017-02-19 DIAGNOSIS — I82220 Acute embolism and thrombosis of inferior vena cava: Secondary | ICD-10-CM | POA: Diagnosis not present

## 2017-02-19 DIAGNOSIS — Z79899 Other long term (current) drug therapy: Secondary | ICD-10-CM | POA: Diagnosis not present

## 2017-02-19 DIAGNOSIS — G620 Drug-induced polyneuropathy: Secondary | ICD-10-CM | POA: Diagnosis not present

## 2017-02-19 DIAGNOSIS — L299 Pruritus, unspecified: Secondary | ICD-10-CM | POA: Diagnosis not present

## 2017-02-19 DIAGNOSIS — I952 Hypotension due to drugs: Secondary | ICD-10-CM | POA: Diagnosis not present

## 2017-02-19 DIAGNOSIS — G893 Neoplasm related pain (acute) (chronic): Secondary | ICD-10-CM | POA: Diagnosis not present

## 2017-02-19 DIAGNOSIS — R69 Illness, unspecified: Secondary | ICD-10-CM | POA: Diagnosis not present

## 2017-02-19 DIAGNOSIS — T451X5A Adverse effect of antineoplastic and immunosuppressive drugs, initial encounter: Secondary | ICD-10-CM | POA: Diagnosis not present

## 2017-02-19 DIAGNOSIS — T451X5D Adverse effect of antineoplastic and immunosuppressive drugs, subsequent encounter: Secondary | ICD-10-CM | POA: Diagnosis not present

## 2017-02-19 DIAGNOSIS — C189 Malignant neoplasm of colon, unspecified: Secondary | ICD-10-CM | POA: Diagnosis not present

## 2017-02-19 DIAGNOSIS — K831 Obstruction of bile duct: Secondary | ICD-10-CM | POA: Diagnosis not present

## 2017-02-19 DIAGNOSIS — T7840XA Allergy, unspecified, initial encounter: Secondary | ICD-10-CM | POA: Diagnosis not present

## 2017-02-25 ENCOUNTER — Other Ambulatory Visit: Payer: Self-pay | Admitting: Radiology

## 2017-02-26 ENCOUNTER — Other Ambulatory Visit (HOSPITAL_COMMUNITY): Payer: Self-pay | Admitting: Interventional Radiology

## 2017-02-26 ENCOUNTER — Encounter (HOSPITAL_COMMUNITY): Payer: Self-pay | Admitting: *Deleted

## 2017-02-26 ENCOUNTER — Ambulatory Visit (HOSPITAL_COMMUNITY)
Admission: RE | Admit: 2017-02-26 | Discharge: 2017-02-26 | Disposition: A | Discharge status: Home / Self Care | Payer: Medicare HMO | Source: Ambulatory Visit | Attending: Interventional Radiology | Admitting: Interventional Radiology

## 2017-02-26 DIAGNOSIS — C787 Secondary malignant neoplasm of liver and intrahepatic bile duct: Principal | ICD-10-CM

## 2017-02-26 DIAGNOSIS — C189 Malignant neoplasm of colon, unspecified: Secondary | ICD-10-CM

## 2017-02-26 DIAGNOSIS — T85590A Other mechanical complication of bile duct prosthesis, initial encounter: Secondary | ICD-10-CM | POA: Diagnosis not present

## 2017-02-26 DIAGNOSIS — R188 Other ascites: Secondary | ICD-10-CM

## 2017-02-26 HISTORY — PX: IR GENERIC HISTORICAL: IMG1180011

## 2017-02-26 NOTE — Progress Notes (Signed)
Patient ID: [name] [name], male   DOB: [birth year], 81 y.o.   MRN: [id number]    Referring Physician(s): [name] [name]  Supervising Physician: [name] [name]  Patient Status: [facility] - Out-pt  Chief Complaint: Leaking PTC drain  Subjective: This patient is well-known to the IR service for metastatic colon cancer with biliary obstruction who had a PTC drain placed in May of 2017.  He had had this exchanged several times and most recently in January 2018 secondary to leakage around the drain itself.  He has put it back to a drainage bag at times, but was tole not do this for for longer periods of time because he was having electrolyte abnormalities.  Since then, he has had this capped off and has bile leakage persist.  He presents today for evaluation of this problem.  He also complains of feeling full in his abdomen.  Allergies: Patient has no known allergies.  Medications: Prior to Admission medications   Medication Sig Start Date End Date Taking? Authorizing Provider  famotidine (PEPCID) 20 MG tablet Take 20 mg by mouth daily as needed for heartburn or indigestion.    Historical Provider, MD  HYDROcodone-acetaminophen (NORCO/VICODIN) 5-325 MG per tablet Take 1 tablet by mouth every 6 (six) hours as needed for moderate pain or severe pain.  09/25/12   Historical Provider, MD  naphazoline-pheniramine (NAPHCON-A) 0.025-0.3 % ophthalmic solution Place 2 drops into both eyes daily as needed for irritation or allergies.     Historical Provider, MD  prochlorperazine (COMPAZINE) 10 MG tablet Take 10 mg by mouth daily as needed for nausea or vomiting.  03/12/14   Historical Provider, MD  zolpidem (AMBIEN) 5 MG tablet Take 5 mg by mouth at bedtime as needed for sleep.  11/17/14   Historical Provider, MD    Vital Signs: There were no vitals taken for this visit.  Physical Exam: Abd: soft, PTC drain is in proper position and is flushed.  It flushes well, but does begin to leak after about 7cc is  injected.  [place] is also used to evaluate his abdomen for fluid.  Unfortunately, there is minimal ascites and no safe window for paracentesis.  Imaging: Ir Radiologist Eval & Mgmt  Result Date: 02/26/2017 Please refer to "Notes" to see consult details.   Labs:  CBC:  Recent Labs  05/16/16 0830 07/02/16 0853  WBC 9.5 6.0  HGB 11.0* 11.3*  HCT 31.6* 35.6*  PLT 211 137*    COAGS:  Recent Labs  05/16/16 0830 07/02/16 0853  INR 1.19 1.44  APTT 32 30    BMP:  Recent Labs  05/16/16 0830 07/02/16 0940  NA 135 139  K 3.9 3.4*  CL 98* 106  CO2 26 26  GLUCOSE 117* 97  BUN 6 <5*  CALCIUM 9.2 8.9  CREATININE 0.79 0.74  GFRNONAA >60 >60  GFRAA >60 >60    LIVER FUNCTION TESTS:  Recent Labs  07/02/16 0940  BILITOT 3.9*  AST 48*  ALT 30  ALKPHOS 297*  PROT 7.1  ALBUMIN 2.7*    Assessment and Plan: 1. PTC drain secondary to biliary obstruction from liver metastasis (colon cancer) -drain appears to be in proper position, but is leaking some.  After discussion with Dr. Pascal [name], we will have the drain returned to a gravity bag at night and capped during the day.  We will do this as when it is just to a gravity bag all the time, he has issues with electrolyte abnormalities and dehydration.  This will be  a compromise to both issues, leakage and electrolyte abnormalities.  The patient and his family understand. -Because of his complaint of abdominal distention we did scan him with [place] for possible paracentesis.  Unfortunately, he did not have enough fluid to be able to perform a paracentesis.  I did personally call his oncologist, Dr. [name], who OK'd this procedure if there was enough fluid.  -the patient was given a gravity bag and his cap was removed to assist with current leakage.  They were encouraged to call us back if his problem persists.    Electronically Signed: Henreitta [name] 02/26/2017, 2:42 PM   I spent a total of 25 Minutes at the the patient's bedside AND on  the patient's hospital floor or unit, greater than 50% of which was counseling/coordinating care for leaking PTC drain.  Metastatic colon cancer with biliary obstruction

## 2017-03-05 DIAGNOSIS — C189 Malignant neoplasm of colon, unspecified: Secondary | ICD-10-CM | POA: Diagnosis not present

## 2017-03-05 DIAGNOSIS — D6481 Anemia due to antineoplastic chemotherapy: Secondary | ICD-10-CM | POA: Diagnosis not present

## 2017-03-05 DIAGNOSIS — T451X5A Adverse effect of antineoplastic and immunosuppressive drugs, initial encounter: Secondary | ICD-10-CM | POA: Diagnosis not present

## 2017-03-05 DIAGNOSIS — T451X5D Adverse effect of antineoplastic and immunosuppressive drugs, subsequent encounter: Secondary | ICD-10-CM | POA: Diagnosis not present

## 2017-03-05 DIAGNOSIS — K838 Other specified diseases of biliary tract: Secondary | ICD-10-CM | POA: Diagnosis not present

## 2017-03-05 DIAGNOSIS — D6959 Other secondary thrombocytopenia: Secondary | ICD-10-CM | POA: Diagnosis not present

## 2017-03-05 DIAGNOSIS — Z5111 Encounter for antineoplastic chemotherapy: Secondary | ICD-10-CM | POA: Diagnosis not present

## 2017-03-05 DIAGNOSIS — G893 Neoplasm related pain (acute) (chronic): Secondary | ICD-10-CM | POA: Diagnosis not present

## 2017-03-05 DIAGNOSIS — R180 Malignant ascites: Secondary | ICD-10-CM | POA: Diagnosis not present

## 2017-03-05 DIAGNOSIS — G620 Drug-induced polyneuropathy: Secondary | ICD-10-CM | POA: Diagnosis not present

## 2017-03-05 DIAGNOSIS — C182 Malignant neoplasm of ascending colon: Secondary | ICD-10-CM | POA: Diagnosis not present

## 2017-03-05 DIAGNOSIS — Z86718 Personal history of other venous thrombosis and embolism: Secondary | ICD-10-CM | POA: Diagnosis not present

## 2017-03-05 DIAGNOSIS — I82220 Acute embolism and thrombosis of inferior vena cava: Secondary | ICD-10-CM | POA: Diagnosis not present

## 2017-03-05 DIAGNOSIS — C787 Secondary malignant neoplasm of liver and intrahepatic bile duct: Secondary | ICD-10-CM | POA: Diagnosis not present

## 2017-04-15 ENCOUNTER — Other Ambulatory Visit (HOSPITAL_COMMUNITY): Payer: Medicare HMO

## 2017-04-23 DIAGNOSIS — I82220 Acute embolism and thrombosis of inferior vena cava: Secondary | ICD-10-CM | POA: Diagnosis not present

## 2017-04-23 DIAGNOSIS — G620 Drug-induced polyneuropathy: Secondary | ICD-10-CM | POA: Diagnosis not present

## 2017-04-23 DIAGNOSIS — C189 Malignant neoplasm of colon, unspecified: Secondary | ICD-10-CM | POA: Diagnosis not present

## 2017-04-23 DIAGNOSIS — D6481 Anemia due to antineoplastic chemotherapy: Secondary | ICD-10-CM | POA: Diagnosis not present

## 2017-04-23 DIAGNOSIS — C182 Malignant neoplasm of ascending colon: Secondary | ICD-10-CM | POA: Diagnosis not present

## 2017-04-23 DIAGNOSIS — R310 Gross hematuria: Secondary | ICD-10-CM | POA: Diagnosis not present

## 2017-04-23 DIAGNOSIS — C787 Secondary malignant neoplasm of liver and intrahepatic bile duct: Secondary | ICD-10-CM | POA: Diagnosis not present

## 2017-04-23 DIAGNOSIS — Z5181 Encounter for therapeutic drug level monitoring: Secondary | ICD-10-CM | POA: Diagnosis not present

## 2017-04-23 DIAGNOSIS — R748 Abnormal levels of other serum enzymes: Secondary | ICD-10-CM | POA: Diagnosis not present

## 2017-04-23 DIAGNOSIS — T451X5A Adverse effect of antineoplastic and immunosuppressive drugs, initial encounter: Secondary | ICD-10-CM | POA: Diagnosis not present

## 2017-04-23 DIAGNOSIS — Z7901 Long term (current) use of anticoagulants: Secondary | ICD-10-CM | POA: Diagnosis not present

## 2017-04-23 DIAGNOSIS — T451X5D Adverse effect of antineoplastic and immunosuppressive drugs, subsequent encounter: Secondary | ICD-10-CM | POA: Diagnosis not present

## 2017-05-06 ENCOUNTER — Other Ambulatory Visit (HOSPITAL_COMMUNITY): Payer: Medicare HMO

## 2017-05-08 ENCOUNTER — Other Ambulatory Visit (HOSPITAL_COMMUNITY): Payer: Self-pay | Admitting: Interventional Radiology

## 2017-05-08 ENCOUNTER — Ambulatory Visit (HOSPITAL_COMMUNITY)
Admission: RE | Admit: 2017-05-08 | Discharge: 2017-05-08 | Disposition: A | Discharge status: Home / Self Care | Payer: Medicare HMO | Source: Ambulatory Visit | Attending: Interventional Radiology | Admitting: Interventional Radiology

## 2017-05-08 ENCOUNTER — Encounter (HOSPITAL_COMMUNITY): Payer: Self-pay | Admitting: Interventional Radiology

## 2017-05-08 DIAGNOSIS — K831 Obstruction of bile duct: Secondary | ICD-10-CM | POA: Diagnosis not present

## 2017-05-08 DIAGNOSIS — Z85038 Personal history of other malignant neoplasm of large intestine: Secondary | ICD-10-CM | POA: Diagnosis not present

## 2017-05-08 DIAGNOSIS — Z4682 Encounter for fitting and adjustment of non-vascular catheter: Secondary | ICD-10-CM | POA: Insufficient documentation

## 2017-05-08 DIAGNOSIS — C787 Secondary malignant neoplasm of liver and intrahepatic bile duct: Secondary | ICD-10-CM | POA: Insufficient documentation

## 2017-05-08 DIAGNOSIS — Z434 Encounter for attention to other artificial openings of digestive tract: Secondary | ICD-10-CM | POA: Diagnosis not present

## 2017-05-08 DIAGNOSIS — C189 Malignant neoplasm of colon, unspecified: Secondary | ICD-10-CM

## 2017-05-08 HISTORY — PX: IR EXCHANGE BILIARY DRAIN: IMG6046

## 2017-05-08 MED ORDER — IOPAMIDOL (ISOVUE-300) INJECTION 61%
INTRAVENOUS | Status: AC
  Administered 2017-05-08: 10 mL
  Filled 2017-05-08: qty 50

## 2017-05-08 MED ORDER — IOPAMIDOL (ISOVUE-300) INJECTION 61%
10.0000 mL | Freq: Once | INTRAVENOUS | Status: AC | PRN
  Administered 2017-05-08: 10 mL

## 2017-05-08 MED ORDER — LIDOCAINE HCL 1 % IJ SOLN
INTRAMUSCULAR | Status: DC | PRN
Start: 2017-05-08 — End: 2017-05-09
  Administered 2017-05-08: 5 mL

## 2017-05-08 MED ORDER — LIDOCAINE HCL 1 % IJ SOLN
INTRAMUSCULAR | Status: AC
  Filled 2017-05-08: qty 20

## 2017-05-14 DIAGNOSIS — R748 Abnormal levels of other serum enzymes: Secondary | ICD-10-CM | POA: Diagnosis not present

## 2017-05-14 DIAGNOSIS — K831 Obstruction of bile duct: Secondary | ICD-10-CM | POA: Diagnosis not present

## 2017-05-14 DIAGNOSIS — Z5181 Encounter for therapeutic drug level monitoring: Secondary | ICD-10-CM | POA: Diagnosis not present

## 2017-05-14 DIAGNOSIS — T451X5A Adverse effect of antineoplastic and immunosuppressive drugs, initial encounter: Secondary | ICD-10-CM | POA: Diagnosis not present

## 2017-05-14 DIAGNOSIS — I82220 Acute embolism and thrombosis of inferior vena cava: Secondary | ICD-10-CM | POA: Diagnosis not present

## 2017-05-14 DIAGNOSIS — Z95828 Presence of other vascular implants and grafts: Secondary | ICD-10-CM | POA: Diagnosis not present

## 2017-05-14 DIAGNOSIS — T451X5D Adverse effect of antineoplastic and immunosuppressive drugs, subsequent encounter: Secondary | ICD-10-CM | POA: Diagnosis not present

## 2017-05-14 DIAGNOSIS — D6959 Other secondary thrombocytopenia: Secondary | ICD-10-CM | POA: Diagnosis not present

## 2017-05-14 DIAGNOSIS — D6481 Anemia due to antineoplastic chemotherapy: Secondary | ICD-10-CM | POA: Diagnosis not present

## 2017-05-14 DIAGNOSIS — G893 Neoplasm related pain (acute) (chronic): Secondary | ICD-10-CM | POA: Diagnosis not present

## 2017-05-14 DIAGNOSIS — R310 Gross hematuria: Secondary | ICD-10-CM | POA: Diagnosis not present

## 2017-05-14 DIAGNOSIS — C189 Malignant neoplasm of colon, unspecified: Secondary | ICD-10-CM | POA: Diagnosis not present

## 2017-05-14 DIAGNOSIS — C787 Secondary malignant neoplasm of liver and intrahepatic bile duct: Secondary | ICD-10-CM | POA: Diagnosis not present

## 2017-05-14 DIAGNOSIS — Z7901 Long term (current) use of anticoagulants: Secondary | ICD-10-CM | POA: Diagnosis not present

## 2017-05-31 DIAGNOSIS — C189 Malignant neoplasm of colon, unspecified: Secondary | ICD-10-CM | POA: Diagnosis not present

## 2017-05-31 DIAGNOSIS — R319 Hematuria, unspecified: Secondary | ICD-10-CM | POA: Diagnosis not present

## 2017-05-31 DIAGNOSIS — C787 Secondary malignant neoplasm of liver and intrahepatic bile duct: Secondary | ICD-10-CM | POA: Diagnosis not present

## 2017-05-31 DIAGNOSIS — Z515 Encounter for palliative care: Secondary | ICD-10-CM | POA: Diagnosis not present

## 2017-06-18 DIAGNOSIS — Z5111 Encounter for antineoplastic chemotherapy: Secondary | ICD-10-CM | POA: Diagnosis not present

## 2017-06-18 DIAGNOSIS — R110 Nausea: Secondary | ICD-10-CM | POA: Diagnosis not present

## 2017-06-18 DIAGNOSIS — Z5181 Encounter for therapeutic drug level monitoring: Secondary | ICD-10-CM | POA: Diagnosis not present

## 2017-06-18 DIAGNOSIS — T451X5A Adverse effect of antineoplastic and immunosuppressive drugs, initial encounter: Secondary | ICD-10-CM | POA: Diagnosis not present

## 2017-06-18 DIAGNOSIS — R310 Gross hematuria: Secondary | ICD-10-CM | POA: Diagnosis not present

## 2017-06-18 DIAGNOSIS — I82220 Acute embolism and thrombosis of inferior vena cava: Secondary | ICD-10-CM | POA: Diagnosis not present

## 2017-06-18 DIAGNOSIS — D6959 Other secondary thrombocytopenia: Secondary | ICD-10-CM | POA: Diagnosis not present

## 2017-06-18 DIAGNOSIS — D6481 Anemia due to antineoplastic chemotherapy: Secondary | ICD-10-CM | POA: Diagnosis not present

## 2017-06-18 DIAGNOSIS — C787 Secondary malignant neoplasm of liver and intrahepatic bile duct: Secondary | ICD-10-CM | POA: Diagnosis not present

## 2017-06-18 DIAGNOSIS — T451X5D Adverse effect of antineoplastic and immunosuppressive drugs, subsequent encounter: Secondary | ICD-10-CM | POA: Diagnosis not present

## 2017-06-18 DIAGNOSIS — C182 Malignant neoplasm of ascending colon: Secondary | ICD-10-CM | POA: Diagnosis not present

## 2017-06-18 DIAGNOSIS — C189 Malignant neoplasm of colon, unspecified: Secondary | ICD-10-CM | POA: Diagnosis not present

## 2017-06-18 DIAGNOSIS — Z7901 Long term (current) use of anticoagulants: Secondary | ICD-10-CM | POA: Diagnosis not present

## 2017-07-12 DIAGNOSIS — J449 Chronic obstructive pulmonary disease, unspecified: Secondary | ICD-10-CM | POA: Diagnosis not present

## 2017-07-12 DIAGNOSIS — G9349 Other encephalopathy: Secondary | ICD-10-CM | POA: Diagnosis not present

## 2017-07-12 DIAGNOSIS — K219 Gastro-esophageal reflux disease without esophagitis: Secondary | ICD-10-CM | POA: Diagnosis not present

## 2017-07-12 DIAGNOSIS — R402441 Other coma, without documented Glasgow coma scale score, or with partial score reported, in the field [EMT or ambulance]: Secondary | ICD-10-CM | POA: Diagnosis not present

## 2017-07-12 DIAGNOSIS — C7801 Secondary malignant neoplasm of right lung: Secondary | ICD-10-CM | POA: Diagnosis not present

## 2017-07-12 DIAGNOSIS — R4182 Altered mental status, unspecified: Secondary | ICD-10-CM | POA: Diagnosis not present

## 2017-07-12 DIAGNOSIS — C787 Secondary malignant neoplasm of liver and intrahepatic bile duct: Secondary | ICD-10-CM | POA: Diagnosis not present

## 2017-07-12 DIAGNOSIS — A419 Sepsis, unspecified organism: Secondary | ICD-10-CM | POA: Diagnosis not present

## 2017-07-12 DIAGNOSIS — J90 Pleural effusion, not elsewhere classified: Secondary | ICD-10-CM | POA: Diagnosis not present

## 2017-07-12 DIAGNOSIS — C7802 Secondary malignant neoplasm of left lung: Secondary | ICD-10-CM | POA: Diagnosis not present

## 2017-07-12 DIAGNOSIS — C189 Malignant neoplasm of colon, unspecified: Secondary | ICD-10-CM | POA: Diagnosis not present

## 2017-07-12 DIAGNOSIS — Z66 Do not resuscitate: Secondary | ICD-10-CM | POA: Diagnosis not present

## 2017-07-12 DIAGNOSIS — R140 Abdominal distension (gaseous): Secondary | ICD-10-CM | POA: Diagnosis not present

## 2017-07-12 DIAGNOSIS — E860 Dehydration: Secondary | ICD-10-CM | POA: Diagnosis not present

## 2017-07-12 DIAGNOSIS — N390 Urinary tract infection, site not specified: Secondary | ICD-10-CM | POA: Diagnosis not present

## 2017-07-12 DIAGNOSIS — R410 Disorientation, unspecified: Secondary | ICD-10-CM | POA: Diagnosis not present

## 2017-07-20 DIAGNOSIS — C7800 Secondary malignant neoplasm of unspecified lung: Secondary | ICD-10-CM | POA: Diagnosis not present

## 2017-07-20 DIAGNOSIS — C189 Malignant neoplasm of colon, unspecified: Secondary | ICD-10-CM | POA: Diagnosis not present

## 2017-07-20 DIAGNOSIS — J449 Chronic obstructive pulmonary disease, unspecified: Secondary | ICD-10-CM | POA: Diagnosis not present

## 2017-07-20 DIAGNOSIS — B957 Other staphylococcus as the cause of diseases classified elsewhere: Secondary | ICD-10-CM | POA: Diagnosis not present

## 2017-07-20 DIAGNOSIS — Z452 Encounter for adjustment and management of vascular access device: Secondary | ICD-10-CM | POA: Diagnosis not present

## 2017-07-20 DIAGNOSIS — C787 Secondary malignant neoplasm of liver and intrahepatic bile duct: Secondary | ICD-10-CM | POA: Diagnosis not present

## 2017-07-20 DIAGNOSIS — N390 Urinary tract infection, site not specified: Secondary | ICD-10-CM | POA: Diagnosis not present

## 2017-07-21 DIAGNOSIS — C189 Malignant neoplasm of colon, unspecified: Secondary | ICD-10-CM | POA: Diagnosis not present

## 2017-07-21 DIAGNOSIS — C787 Secondary malignant neoplasm of liver and intrahepatic bile duct: Secondary | ICD-10-CM | POA: Diagnosis not present

## 2017-07-21 DIAGNOSIS — C7800 Secondary malignant neoplasm of unspecified lung: Secondary | ICD-10-CM | POA: Diagnosis not present

## 2017-07-21 DIAGNOSIS — Z452 Encounter for adjustment and management of vascular access device: Secondary | ICD-10-CM | POA: Diagnosis not present

## 2017-07-21 DIAGNOSIS — J449 Chronic obstructive pulmonary disease, unspecified: Secondary | ICD-10-CM | POA: Diagnosis not present

## 2017-07-21 DIAGNOSIS — B957 Other staphylococcus as the cause of diseases classified elsewhere: Secondary | ICD-10-CM | POA: Diagnosis not present

## 2017-07-21 DIAGNOSIS — N390 Urinary tract infection, site not specified: Secondary | ICD-10-CM | POA: Diagnosis not present

## 2017-07-22 DIAGNOSIS — J449 Chronic obstructive pulmonary disease, unspecified: Secondary | ICD-10-CM | POA: Diagnosis not present

## 2017-07-22 DIAGNOSIS — C189 Malignant neoplasm of colon, unspecified: Secondary | ICD-10-CM | POA: Diagnosis not present

## 2017-07-22 DIAGNOSIS — C7800 Secondary malignant neoplasm of unspecified lung: Secondary | ICD-10-CM | POA: Diagnosis not present

## 2017-07-22 DIAGNOSIS — B957 Other staphylococcus as the cause of diseases classified elsewhere: Secondary | ICD-10-CM | POA: Diagnosis not present

## 2017-07-22 DIAGNOSIS — N390 Urinary tract infection, site not specified: Secondary | ICD-10-CM | POA: Diagnosis not present

## 2017-07-22 DIAGNOSIS — C787 Secondary malignant neoplasm of liver and intrahepatic bile duct: Secondary | ICD-10-CM | POA: Diagnosis not present

## 2017-07-22 DIAGNOSIS — Z452 Encounter for adjustment and management of vascular access device: Secondary | ICD-10-CM | POA: Diagnosis not present

## 2017-07-23 DIAGNOSIS — T451X5A Adverse effect of antineoplastic and immunosuppressive drugs, initial encounter: Secondary | ICD-10-CM | POA: Diagnosis not present

## 2017-07-23 DIAGNOSIS — G893 Neoplasm related pain (acute) (chronic): Secondary | ICD-10-CM | POA: Diagnosis not present

## 2017-07-23 DIAGNOSIS — I82220 Acute embolism and thrombosis of inferior vena cava: Secondary | ICD-10-CM | POA: Diagnosis not present

## 2017-07-23 DIAGNOSIS — K5900 Constipation, unspecified: Secondary | ICD-10-CM | POA: Diagnosis not present

## 2017-07-23 DIAGNOSIS — C787 Secondary malignant neoplasm of liver and intrahepatic bile duct: Secondary | ICD-10-CM | POA: Diagnosis not present

## 2017-07-23 DIAGNOSIS — R180 Malignant ascites: Secondary | ICD-10-CM | POA: Diagnosis not present

## 2017-07-23 DIAGNOSIS — C189 Malignant neoplasm of colon, unspecified: Secondary | ICD-10-CM | POA: Diagnosis not present

## 2017-07-23 DIAGNOSIS — J9811 Atelectasis: Secondary | ICD-10-CM | POA: Diagnosis not present

## 2017-07-23 DIAGNOSIS — T451X5D Adverse effect of antineoplastic and immunosuppressive drugs, subsequent encounter: Secondary | ICD-10-CM | POA: Diagnosis not present

## 2017-07-23 DIAGNOSIS — R918 Other nonspecific abnormal finding of lung field: Secondary | ICD-10-CM | POA: Diagnosis not present

## 2017-07-23 DIAGNOSIS — K8689 Other specified diseases of pancreas: Secondary | ICD-10-CM | POA: Diagnosis not present

## 2017-07-23 DIAGNOSIS — R110 Nausea: Secondary | ICD-10-CM | POA: Diagnosis not present

## 2017-07-23 DIAGNOSIS — Z85038 Personal history of other malignant neoplasm of large intestine: Secondary | ICD-10-CM | POA: Diagnosis not present

## 2017-07-23 DIAGNOSIS — J90 Pleural effusion, not elsewhere classified: Secondary | ICD-10-CM | POA: Diagnosis not present

## 2017-07-23 DIAGNOSIS — K6389 Other specified diseases of intestine: Secondary | ICD-10-CM | POA: Diagnosis not present

## 2017-07-23 DIAGNOSIS — R911 Solitary pulmonary nodule: Secondary | ICD-10-CM | POA: Diagnosis not present

## 2017-07-23 DIAGNOSIS — Z95828 Presence of other vascular implants and grafts: Secondary | ICD-10-CM | POA: Diagnosis not present

## 2017-07-23 DIAGNOSIS — R748 Abnormal levels of other serum enzymes: Secondary | ICD-10-CM | POA: Diagnosis not present

## 2017-07-23 DIAGNOSIS — D6959 Other secondary thrombocytopenia: Secondary | ICD-10-CM | POA: Diagnosis not present

## 2017-07-23 DIAGNOSIS — N390 Urinary tract infection, site not specified: Secondary | ICD-10-CM | POA: Diagnosis not present

## 2017-07-23 DIAGNOSIS — D6481 Anemia due to antineoplastic chemotherapy: Secondary | ICD-10-CM | POA: Diagnosis not present

## 2017-07-23 DIAGNOSIS — K7689 Other specified diseases of liver: Secondary | ICD-10-CM | POA: Diagnosis not present

## 2017-07-23 DIAGNOSIS — K838 Other specified diseases of biliary tract: Secondary | ICD-10-CM | POA: Diagnosis not present

## 2017-07-23 DIAGNOSIS — R69 Illness, unspecified: Secondary | ICD-10-CM | POA: Diagnosis not present

## 2017-07-24 DIAGNOSIS — J449 Chronic obstructive pulmonary disease, unspecified: Secondary | ICD-10-CM | POA: Diagnosis not present

## 2017-07-24 DIAGNOSIS — C7800 Secondary malignant neoplasm of unspecified lung: Secondary | ICD-10-CM | POA: Diagnosis not present

## 2017-07-24 DIAGNOSIS — Z452 Encounter for adjustment and management of vascular access device: Secondary | ICD-10-CM | POA: Diagnosis not present

## 2017-07-24 DIAGNOSIS — B957 Other staphylococcus as the cause of diseases classified elsewhere: Secondary | ICD-10-CM | POA: Diagnosis not present

## 2017-07-24 DIAGNOSIS — N390 Urinary tract infection, site not specified: Secondary | ICD-10-CM | POA: Diagnosis not present

## 2017-07-24 DIAGNOSIS — C787 Secondary malignant neoplasm of liver and intrahepatic bile duct: Secondary | ICD-10-CM | POA: Diagnosis not present

## 2017-07-24 DIAGNOSIS — C189 Malignant neoplasm of colon, unspecified: Secondary | ICD-10-CM | POA: Diagnosis not present

## 2017-07-25 DIAGNOSIS — N390 Urinary tract infection, site not specified: Secondary | ICD-10-CM | POA: Diagnosis not present

## 2017-07-26 DIAGNOSIS — Z452 Encounter for adjustment and management of vascular access device: Secondary | ICD-10-CM | POA: Diagnosis not present

## 2017-07-26 DIAGNOSIS — C189 Malignant neoplasm of colon, unspecified: Secondary | ICD-10-CM | POA: Diagnosis not present

## 2017-07-26 DIAGNOSIS — C787 Secondary malignant neoplasm of liver and intrahepatic bile duct: Secondary | ICD-10-CM | POA: Diagnosis not present

## 2017-07-26 DIAGNOSIS — C7800 Secondary malignant neoplasm of unspecified lung: Secondary | ICD-10-CM | POA: Diagnosis not present

## 2017-07-26 DIAGNOSIS — J449 Chronic obstructive pulmonary disease, unspecified: Secondary | ICD-10-CM | POA: Diagnosis not present

## 2017-07-26 DIAGNOSIS — N390 Urinary tract infection, site not specified: Secondary | ICD-10-CM | POA: Diagnosis not present

## 2017-07-26 DIAGNOSIS — B957 Other staphylococcus as the cause of diseases classified elsewhere: Secondary | ICD-10-CM | POA: Diagnosis not present

## 2017-07-31 ENCOUNTER — Encounter (HOSPITAL_COMMUNITY): Payer: Self-pay | Admitting: Interventional Radiology

## 2017-07-31 ENCOUNTER — Other Ambulatory Visit (HOSPITAL_COMMUNITY): Payer: Self-pay | Admitting: Interventional Radiology

## 2017-07-31 ENCOUNTER — Ambulatory Visit (HOSPITAL_COMMUNITY)
Admission: RE | Admit: 2017-07-31 | Discharge: 2017-07-31 | Disposition: A | Discharge status: Home / Self Care | Payer: Medicare HMO | Source: Ambulatory Visit | Attending: Interventional Radiology | Admitting: Interventional Radiology

## 2017-07-31 DIAGNOSIS — C189 Malignant neoplasm of colon, unspecified: Secondary | ICD-10-CM | POA: Diagnosis not present

## 2017-07-31 DIAGNOSIS — K831 Obstruction of bile duct: Secondary | ICD-10-CM | POA: Diagnosis not present

## 2017-07-31 DIAGNOSIS — C787 Secondary malignant neoplasm of liver and intrahepatic bile duct: Principal | ICD-10-CM

## 2017-07-31 DIAGNOSIS — Z4803 Encounter for change or removal of drains: Secondary | ICD-10-CM | POA: Insufficient documentation

## 2017-07-31 HISTORY — PX: IR EXCHANGE BILIARY DRAIN: IMG6046

## 2017-07-31 MED ORDER — LIDOCAINE HCL 1 % IJ SOLN
INTRAMUSCULAR | Status: AC
  Filled 2017-07-31: qty 20

## 2017-07-31 MED ORDER — IOPAMIDOL (ISOVUE-300) INJECTION 61%
50.0000 mL | Freq: Once | INTRAVENOUS | Status: AC | PRN
  Administered 2017-07-31: 10 mL

## 2017-07-31 MED ORDER — IOPAMIDOL (ISOVUE-300) INJECTION 61%
INTRAVENOUS | Status: AC
  Administered 2017-07-31: 10 mL
  Filled 2017-07-31: qty 50

## 2017-07-31 MED ORDER — LIDOCAINE HCL 1 % IJ SOLN
INTRAMUSCULAR | Status: AC | PRN
  Administered 2017-07-31: 10 mL via INTRADERMAL

## 2017-08-06 DIAGNOSIS — G893 Neoplasm related pain (acute) (chronic): Secondary | ICD-10-CM | POA: Diagnosis not present

## 2017-08-06 DIAGNOSIS — Z95828 Presence of other vascular implants and grafts: Secondary | ICD-10-CM | POA: Diagnosis not present

## 2017-08-06 DIAGNOSIS — C787 Secondary malignant neoplasm of liver and intrahepatic bile duct: Secondary | ICD-10-CM | POA: Diagnosis not present

## 2017-08-06 DIAGNOSIS — D6959 Other secondary thrombocytopenia: Secondary | ICD-10-CM | POA: Diagnosis not present

## 2017-08-06 DIAGNOSIS — C189 Malignant neoplasm of colon, unspecified: Secondary | ICD-10-CM | POA: Diagnosis not present

## 2017-08-06 DIAGNOSIS — I82220 Acute embolism and thrombosis of inferior vena cava: Secondary | ICD-10-CM | POA: Diagnosis not present

## 2017-08-06 DIAGNOSIS — T451X5D Adverse effect of antineoplastic and immunosuppressive drugs, subsequent encounter: Secondary | ICD-10-CM | POA: Diagnosis not present

## 2017-08-06 DIAGNOSIS — Z9049 Acquired absence of other specified parts of digestive tract: Secondary | ICD-10-CM | POA: Diagnosis not present

## 2017-08-06 DIAGNOSIS — C182 Malignant neoplasm of ascending colon: Secondary | ICD-10-CM | POA: Diagnosis not present

## 2017-08-06 DIAGNOSIS — T451X5A Adverse effect of antineoplastic and immunosuppressive drugs, initial encounter: Secondary | ICD-10-CM | POA: Diagnosis not present

## 2017-08-06 DIAGNOSIS — K831 Obstruction of bile duct: Secondary | ICD-10-CM | POA: Diagnosis not present

## 2017-08-06 DIAGNOSIS — Z9889 Other specified postprocedural states: Secondary | ICD-10-CM | POA: Diagnosis not present

## 2017-08-06 DIAGNOSIS — R110 Nausea: Secondary | ICD-10-CM | POA: Diagnosis not present

## 2017-08-06 DIAGNOSIS — D6481 Anemia due to antineoplastic chemotherapy: Secondary | ICD-10-CM | POA: Diagnosis not present

## 2017-08-06 DIAGNOSIS — K838 Other specified diseases of biliary tract: Secondary | ICD-10-CM | POA: Diagnosis not present

## 2017-08-31 DEATH — deceased

## 2017-10-23 ENCOUNTER — Other Ambulatory Visit (HOSPITAL_COMMUNITY): Payer: Medicare HMO
